# Patient Record
Sex: Female | Born: 1985
Health system: Southern US, Community
[De-identification: ages and names within clinical notes are randomized; demographics above are authoritative.]

## PROBLEM LIST (undated history)

## (undated) ENCOUNTER — Inpatient Hospital Stay (HOSPITAL_COMMUNITY): Payer: Self-pay

## (undated) DIAGNOSIS — A630 Anogenital (venereal) warts: Secondary | ICD-10-CM

## (undated) DIAGNOSIS — B999 Unspecified infectious disease: Secondary | ICD-10-CM

## (undated) DIAGNOSIS — E039 Hypothyroidism, unspecified: Secondary | ICD-10-CM

## (undated) DIAGNOSIS — J302 Other seasonal allergic rhinitis: Secondary | ICD-10-CM

## (undated) DIAGNOSIS — IMO0002 Reserved for concepts with insufficient information to code with codable children: Secondary | ICD-10-CM

## (undated) DIAGNOSIS — K219 Gastro-esophageal reflux disease without esophagitis: Secondary | ICD-10-CM

## (undated) DIAGNOSIS — O139 Gestational [pregnancy-induced] hypertension without significant proteinuria, unspecified trimester: Secondary | ICD-10-CM

## (undated) DIAGNOSIS — R87619 Unspecified abnormal cytological findings in specimens from cervix uteri: Secondary | ICD-10-CM

## (undated) DIAGNOSIS — R87629 Unspecified abnormal cytological findings in specimens from vagina: Secondary | ICD-10-CM

## (undated) DIAGNOSIS — D649 Anemia, unspecified: Secondary | ICD-10-CM

## (undated) DIAGNOSIS — O24419 Gestational diabetes mellitus in pregnancy, unspecified control: Secondary | ICD-10-CM

## (undated) HISTORY — PX: COLPOSCOPY: SHX161

## (undated) HISTORY — DX: Unspecified abnormal cytological findings in specimens from cervix uteri: R87.619

## (undated) HISTORY — DX: Gastro-esophageal reflux disease without esophagitis: K21.9

## (undated) HISTORY — DX: Gestational (pregnancy-induced) hypertension without significant proteinuria, unspecified trimester: O13.9

## (undated) HISTORY — PX: WISDOM TOOTH EXTRACTION: SHX21

## (undated) HISTORY — DX: Anogenital (venereal) warts: A63.0

## (undated) HISTORY — DX: Anemia, unspecified: D64.9

## (undated) HISTORY — DX: Other seasonal allergic rhinitis: J30.2

## (undated) HISTORY — PX: NO PAST SURGERIES: SHX2092

## (undated) HISTORY — DX: Reserved for concepts with insufficient information to code with codable children: IMO0002

---
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
## 2006-01-28 ENCOUNTER — Other Ambulatory Visit: Admission: RE | Admit: 2006-01-28 | Discharge: 2006-01-28 | Payer: Self-pay | Admitting: Family Medicine

## 2006-07-01 ENCOUNTER — Other Ambulatory Visit: Admission: RE | Admit: 2006-07-01 | Discharge: 2006-07-01 | Payer: Self-pay | Admitting: Obstetrics & Gynecology

## 2006-11-24 ENCOUNTER — Other Ambulatory Visit: Admission: RE | Admit: 2006-11-24 | Discharge: 2006-11-24 | Payer: Self-pay | Admitting: Obstetrics & Gynecology

## 2007-04-30 ENCOUNTER — Other Ambulatory Visit: Admission: RE | Admit: 2007-04-30 | Discharge: 2007-04-30 | Payer: Self-pay | Admitting: Obstetrics & Gynecology

## 2007-11-04 ENCOUNTER — Other Ambulatory Visit: Admission: RE | Admit: 2007-11-04 | Discharge: 2007-11-04 | Payer: Self-pay | Admitting: Obstetrics & Gynecology

## 2008-04-19 ENCOUNTER — Ambulatory Visit: Payer: Self-pay | Admitting: Internal Medicine

## 2008-04-19 DIAGNOSIS — J4540 Moderate persistent asthma, uncomplicated: Secondary | ICD-10-CM | POA: Insufficient documentation

## 2008-05-06 ENCOUNTER — Other Ambulatory Visit: Admission: RE | Admit: 2008-05-06 | Discharge: 2008-05-06 | Payer: Self-pay | Admitting: Obstetrics & Gynecology

## 2008-05-20 ENCOUNTER — Ambulatory Visit: Payer: Self-pay | Admitting: Internal Medicine

## 2008-11-11 ENCOUNTER — Other Ambulatory Visit: Admission: RE | Admit: 2008-11-11 | Discharge: 2008-11-11 | Payer: Self-pay | Admitting: Obstetrics & Gynecology

## 2009-01-09 ENCOUNTER — Telehealth (INDEPENDENT_AMBULATORY_CARE_PROVIDER_SITE_OTHER): Payer: Self-pay | Admitting: *Deleted

## 2009-03-29 ENCOUNTER — Telehealth: Payer: Self-pay | Admitting: Internal Medicine

## 2009-03-29 ENCOUNTER — Telehealth (INDEPENDENT_AMBULATORY_CARE_PROVIDER_SITE_OTHER): Payer: Self-pay | Admitting: *Deleted

## 2009-04-12 ENCOUNTER — Other Ambulatory Visit: Admission: RE | Admit: 2009-04-12 | Discharge: 2009-04-12 | Payer: Self-pay | Admitting: Obstetrics & Gynecology

## 2009-05-25 ENCOUNTER — Telehealth (INDEPENDENT_AMBULATORY_CARE_PROVIDER_SITE_OTHER): Payer: Self-pay | Admitting: *Deleted

## 2009-05-29 ENCOUNTER — Ambulatory Visit: Payer: Self-pay | Admitting: Internal Medicine

## 2009-08-16 ENCOUNTER — Telehealth (INDEPENDENT_AMBULATORY_CARE_PROVIDER_SITE_OTHER): Payer: Self-pay | Admitting: *Deleted

## 2010-02-21 ENCOUNTER — Telehealth: Payer: Self-pay | Admitting: Internal Medicine

## 2010-03-30 ENCOUNTER — Ambulatory Visit: Payer: Self-pay | Admitting: Internal Medicine

## 2010-03-30 DIAGNOSIS — R21 Rash and other nonspecific skin eruption: Secondary | ICD-10-CM | POA: Insufficient documentation

## 2010-03-30 DIAGNOSIS — J309 Allergic rhinitis, unspecified: Secondary | ICD-10-CM | POA: Insufficient documentation

## 2010-04-02 ENCOUNTER — Telehealth: Payer: Self-pay | Admitting: Internal Medicine

## 2011-01-23 NOTE — Progress Notes (Signed)
Summary: TALK TO DR [facility]  Phone Note Call from Patient   Caller: Patient Call For: [facility] Summary of Call: PT WOULD LIKE TO TALK TO DR Marchelle Gearing ABOUT APPT SHE HAD ON 03-30-2010 Initial call taken by: Rickard Patience,  April 02, 2010 10:05 AM  Follow-up for Phone Call        The patient is wanting to continue on Advair for now. She says she is following all other recs from last OV and she is taking her Advair twice daily. She would like to wait on starting the [facility] and discuss this further at her next OV in 6 weeks. Please advise.Michel Bickers [facility]  April 02, 2010 11:54 AM  Additional Follow-up for Phone Call Additional follow up Details #1::        that is fine. She just needs to be 100% compliant. Please confirm her advair dose and if she wants she can come in and take samples. Pls convey message. Does she still need to talk to me? Additional Follow-up by: Kalman Shan MD,  April 02, 2010 12:33 PM    Additional Follow-up for Phone Call Additional follow up Details #2::    pt advised of recs. she states nothing further needed. she also requests rx be sent to [facility] on [address] for advair. She also asks about coupons for advair, she request this be mailed to [address] [place] [address]. I have placed coupon in the mail, rx sent pt aware.Carron Curie CMA  April 02, 2010 12:45 PM   New/Updated Medications: ADVAIR DISKUS 250-50 MCG/DOSE AEPB (FLUTICASONE-SALMETEROL) one puff twice daily Prescriptions: ADVAIR DISKUS 250-50 MCG/DOSE AEPB (FLUTICASONE-SALMETEROL) one puff twice daily  #1 x 6   Entered by:   Carron Curie CMA   Authorized by:   Kalman Shan MD   Signed by:   Carron Curie CMA on 04/02/2010   Method used:   Electronically to        [facility] [address] (retail)       [phone]       [place], [place]  [address]       Ph: [phone]       Fax: [phone]   RxID:   [id number]

## 2011-01-23 NOTE — Progress Notes (Signed)
Summary: speak to doc  Phone Note Call from Patient Call back at Home Phone [phone]   Caller: Patient Call For: Obryan Radu Reason for Call: Talk to Doctor Summary of Call: pt wants to speak to MR re: what she is going thru - pt has an appt in April, thinks she may need some prednisone, asthma getting a little worse.  Wanted to touch base with him and see if he wants her to come in sooner. Initial call taken by: Eugene Gavia,  February 21, 2010 1:40 PM  Follow-up for Phone Call        spoke with pt and she states she is not compliant with her advair and she used to be able to go 3 days without noticing she missed using it, but now she states she is having to use it everyday, which is what she is supposed to do. She states she is having increased wheezing and chest tightness and thinks she may need prednisone. Pt stated she had wanted to speak to MR personally. I stated I could send him a note to ask him to call pt. Sh estated there was no need. Sh ehas made an appt for april, but states she will call back to move appt up sooner once she receives her current school schedule. Sh states we do not need to sen dmessage to MR about prednisone, she will wait to appt.  Carron Curie CMA  February 21, 2010 3:48 PM

## 2011-01-23 NOTE — Assessment & Plan Note (Signed)
Summary: rov/apc   Visit Type:  Follow-up Copy to:  Dr. Charna Elizabeth Primary Provider/Referring Provider:  Dr. Leanora Ivanoff [facility]   History of Present Illness: ASTHMA - Last visit 04/19/2008 and 04/2008 and 05/29/2009. On advair 250/50 a  OV 03/30/2010 Followup Asthma. She has not seen me in almost a year. Quit work Dec 2010. 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 Moved out of parents. Living with husband in independent house. Jan - March: asthma was bad, was wheezing all the time, using xoepenex a lot but was noncompliant with advair. Now feeling better afte starting advair 250/50 1 puff two times a day 2 weeks ago. Not wheezing. Not using xopenex. AGain social stressors but better than June 2010. New school. New house. Married life is better. Concerned about spring season and allergies. Is reporting eyes itching, sinus drainage and urtricarial rash occ.   CardioPerfect Spirometry  ID: [id number] Patient: KENNEDE, Cathy Daniels DOB: 1986-10-29 Age: 25 Years Old Sex: Female Race: White Height: 63 Weight: 194.38 Status: Unconfirmed Past Medical History:  GERD - on aciphex since march 2008. Under control. Stopped taking mid-march 2009 Spring time allergies Anemia Sulfur Allergy - rash/hive asthma - 2009 Recorded: 03/30/2010 09:33 AM  Parameter  Measured Predicted %Predicted FVC     2.79        3.44        81 FEV1     1.90        3.00        63.50 FEV1%   68.31        85.92        79.50 PEF    3.19        6.74        47.40   Interpretation: moderate obstructon, personally reviwed  Current Medications (verified): 1)  Xopenex Hfa 45 Mcg/act  Aero (Levalbuterol Tartrate) .... 2 Puff As Needed Ever 6 Hours 2)  Advair Diskus 250-50 Mcg/dose Aepb (Fluticasone-Salmeterol) .... One Puff Twice Daily 3)  Complete Allergy Medicine 25 Mg Tabs (Diphenhydramine Hcl) .... As Needed  Allergies (verified): 1)  ! 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 GERD - on aciphex since march 2008. Under control. Stopped taking mid-march 2009 Spring time allergies Anemia Sulfur Allergy - rash/hive asthma - 2009  Past Surgical History: Reviewed history from 04/19/2008 and no changes required. None  Family History: Reviewed history from 04/19/2008 and no changes required. 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  rash +  Vital Signs:  Patient profile:   25 year old female Height:      63 inches Weight:      194.38 pounds BMI:     34.56 O2 Sat:      98 % on Room air Temp:     98.0 degrees F oral Pulse rate:   95 / minute BP sitting:   112 / 62  (right arm) Cuff size:   regular  Vitals Entered By: Carron Curie CMA (March 30, 2010 9:19 AM)  O2 Flow:  Room air Comments Medications reviewed with patient Carron Curie CMA  March 30, 2010 9:20 AM Daytime phone number verified with patient.    Physical Exam  General:  well developed, well nourished, in no acute distress Head:  normocephalic and atraumatic Eyes:  PERRLA/EOM intact; conjunctiva and sclera clear Ears:  TMs intact and clear with normal canals Nose:  no deformity, discharge, inflammation, or lesions Mouth:  no deformity or lesions Neck:  no masses, thyromegaly, or abnormal cervical nodes Chest Wall:  no deformities noted Lungs:  clear bilaterally to auscultation and percussion Heart:  regular rate and rhythm, S1, S2 without murmurs, rubs, gallops, or clicks Abdomen:  bowel sounds positive; abdomen soft and non-tender without masses, or organomegaly Msk:  no deformity or scoliosis noted with normal posture Pulses:  pulses normal Extremities:  no clubbing, cyanosis, edema, or deformity noted Neurologic:  CN II-XII grossly intact with normal reflexes, coordination, muscle strength and tone Skin:  eczematous rash:.  rt neck and foream  Cervical Nodes:  no significant adenopathy Axillary Nodes:  no significant adenopathy Psych:  alert and cooperative; normal mood and affect; normal attention span and concentration   Pulmonary Function Test Date: 03/30/2010 09:33 AM Gender: Female  Pre-Spirometry FVC    Value: 2.79 L/min   % Pred: 81 % FEV1    Value: 1.90 L     Pred: 3.00 L     % Pred: 63.50 % FEV1/FVC  Value: 68.31 %     % Pred: 79.50 %  Comments: 600cc drop in fev1 since June 2010  Impression & Recommendations:  Problem # 1:  RASH AND OTHER NONSPECIFIC SKIN ERUPTION (ICD-782.1) Assessment New  will address at fu. Might need allergy referral. Need to establish inhaler compliance first  Orders: Est. Patient Level IV (40981)  Problem # 2:  ASTHMA, PERSISTENT, SEVERE (ICD-493.90) Assessment: Deteriorated Symptms are better but fev1 is worse compare to June 2010. She is poorly compliant  PLAN Counselled on  importance of compliance Swtich advair medium dose to high dose dulera rov 6 weeks with spirometry  Problem # 3:  ALLERGIC RHINITIS CAUSE UNSPECIFIED (ICD-477.9) Assessment: New  Has recurrence of her spring nasal allergies  plan nasal steroids Her updated medication list for this problem includes:    Complete Allergy Medicine 25 Mg Tabs (Diphenhydramine hcl) .[name]... As needed    Fluticasone Propionate 50 Mcg/act Susp (Fluticasone propionate) .[name]... 2 squirts each nostril daily  Orders: Est. Patient Level IV (19147)  Medications Added to Medication List This Visit: 1)  Advair Diskus 250-50 Mcg/dose Aepb (Fluticasone-salmeterol) .... One puff twice daily 2)  Dulera 200-5 Mcg/act Aero (Mometasone furo-formoterol fum) .... 2 puffs twice per day 3)  Complete Allergy Medicine 25 Mg Tabs (Diphenhydramine hcl) .... As needed 4)  Fluticasone Propionate 50 Mcg/act Susp (Fluticasone propionate) .... 2 squirts each nostril daily  Patient Instructions: 1)  stop advair 2)  take dulera high dose 2 puff  two times a day  3)   - take 2 samples with you 4)   - show technique to my nurse 5)  take some nasal steroid samples (2) 6)   - show technique to my nurse 7)    - take it 2 squirs each nostril daily 8)    - after samples over use prescription fluticasone 9)  use netti pot as directed in the manual at pharmacy 10)  return to see me 6 weeks  11)  spirometry at followup 12)  compliance important Prescriptions: FLUTICASONE PROPIONATE 50 MCG/ACT SUSP (FLUTICASONE PROPIONATE) 2 squirts each nostril daily  #1 x 6   Entered and Authorized by:   Kalman Shan MD   Signed by:   Kalman Shan MD on 03/30/2010   Method used:   Electronically to        [facility] [address] (retail)       [phone]       [place], [place]  [address]       Ph: [phone]       Fax: [phone]   RxID:   [id number] DULERA 200-5 MCG/ACT AERO (MOMETASONE FURO-FORMOTEROL  FUM) 2 puffs twice per day  #1 x 6   Entered and Authorized by:   Kalman Shan MD   Signed by:   Kalman Shan MD on 03/30/2010   Method used:   Electronically to        [facility] [address] (retail)       [phone]       [place], [place]  [address]       Ph: [phone]       Fax: [phone]   RxID:   [id number]    Immunization History:  Influenza Immunization History:    Influenza:  fluvax 3+ (11/22/2009)  Appended Document: rov/apc     Allergies: 1)  ! Sulfa   Other Orders: HFA Instruction [id number])

## 2011-05-10 ENCOUNTER — Telehealth: Payer: Self-pay | Admitting: Internal Medicine

## 2011-05-10 MED ORDER — FLUTICASONE-SALMETEROL 250-50 MCG/DOSE IN AEPB: 1.0000 | INHALATION_SPRAY | Freq: Two times a day (BID) | RESPIRATORY_TRACT | Status: DC

## 2011-05-10 NOTE — Telephone Encounter (Signed)
Called, spoke with pt.  States she was trying to get refills on advair 250/50 but was told the rx had expired but she still has refills left for advair 500.  Pt requesting rx for adviar 250 as this is what strength she is supposed to be on - I explained to her it has been over 1 yr since seen.  Will send in rx to last but OV will need to be kept.  Pt scheduled ov for July 24 with MR and verbalized understanding of instructions.

## 2011-06-10 ENCOUNTER — Ambulatory Visit: Payer: Self-pay | Admitting: Internal Medicine

## 2011-07-05 ENCOUNTER — Telehealth: Payer: Self-pay | Admitting: *Deleted

## 2011-07-05 NOTE — Telephone Encounter (Signed)
I need to r/s the pt appt. Pt can be placed on TP schedule. I lmtcbx1. Carron Curie, CMA

## 2011-07-08 NOTE — Telephone Encounter (Signed)
PATIENT RETURNED JENNIFER'S CALL.  SHE DOES NOT WANT TO SEE ANYONE OTHER THAN DR Marchelle Gearing

## 2011-07-08 NOTE — Telephone Encounter (Signed)
Pt appt changed to 07-26-11. Carron Curie, CMA

## 2011-07-16 ENCOUNTER — Ambulatory Visit: Payer: Self-pay | Admitting: Internal Medicine

## 2011-07-26 ENCOUNTER — Encounter: Payer: Self-pay | Admitting: Internal Medicine

## 2011-07-26 ENCOUNTER — Ambulatory Visit (INDEPENDENT_AMBULATORY_CARE_PROVIDER_SITE_OTHER): Payer: Managed Care, Other (non HMO) | Admitting: Internal Medicine

## 2011-07-26 VITALS — BP 112/82 | HR 78 | Temp 97.5°F | Ht 64.0 in | Wt 209.4 lb

## 2011-07-26 DIAGNOSIS — J45909 Unspecified asthma, uncomplicated: Secondary | ICD-10-CM

## 2011-07-26 NOTE — Patient Instructions (Signed)
Your asthma numbers show good control Continue current dose of advair without fail We will call you regarding AUSTRI study Take some nasal steroid and advair samples Have flu shot in fall  Return in 9 months or sooner if needed

## 2011-07-26 NOTE — Progress Notes (Signed)
  Subjective:    Patient ID: Cathy Daniels, female    DOB: 1986-09-04, 25 y.o.   MRN: [id number]  HPI  Followup for a) Moderate - severe persistent asthma. Prior visits April 2009, May 2009, June 2010 and April 2011. Maintained on advair; b) spring nasal allergies  OV 07/26/2011: Doing well from asthma standpoint. In interim since April 2011, reports improved symptoms and very good asthma control.  Denies current dyspnea. Had dyspnea in spring worsened by pollen and was associated with nasal allergies. She is compliant now with advair which is helping. Denies associcated wheeze, chest tightness, cough, nocturnal awakening or albuterol rescue use in several months, fever, chills, nausea, vomit. 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 Not smoking. Husband looking for job Family hx: no new dx of asthma   Review of Systems  Constitutional: Negative for fever and unexpected weight change.  HENT: Negative for ear pain, nosebleeds, congestion, sore throat, rhinorrhea, sneezing, trouble swallowing, dental problem, postnasal drip and sinus pressure.   Eyes: Negative for redness and itching.  Respiratory: Negative for cough, chest tightness, shortness of breath and wheezing.   Cardiovascular: Negative for palpitations and leg swelling.  Gastrointestinal: Negative for nausea and vomiting.  Genitourinary: Negative for dysuria.  Musculoskeletal: Negative for joint swelling.  Skin: Negative for rash.  Neurological: Negative for headaches.  Hematological: Does not bruise/bleed easily.  Psychiatric/Behavioral: Negative for dysphoric mood. The patient is not nervous/anxious.        Objective:   Physical Exam  Vitals reviewed. Constitutional: She is oriented to person, place, and time. She appears well-developed and well-nourished. No distress.     obese  HENT:  Head: Normocephalic and atraumatic.  Right Ear: External ear normal.  Left Ear: External ear normal.  Mouth/Throat: Oropharynx is clear and moist. No oropharyngeal exudate.  Eyes: Conjunctivae and EOM are normal. Pupils are equal, round, and reactive to light. Right eye exhibits no discharge. Left eye exhibits no discharge. No scleral icterus.  Neck: Normal range of motion. Neck supple. No JVD present. No tracheal deviation present. No thyromegaly present.  Cardiovascular: Normal rate, regular rhythm, normal heart sounds and intact distal pulses.  Exam reveals no gallop and no friction rub.   No murmur heard. Pulmonary/Chest: Effort normal and breath sounds normal. No respiratory distress. She has no wheezes. She has no rales. She exhibits no tenderness.  Abdominal: Soft. Bowel sounds are normal. She exhibits no distension and no mass. There is no tenderness. There is no rebound and no guarding.  Musculoskeletal: Normal range of motion. She exhibits no edema and no tenderness.  Lymphadenopathy:    She has no cervical adenopathy.  Neurological: She is alert and oriented to person, place, and time. She has normal reflexes. No cranial nerve deficit. She exhibits normal muscle tone. Coordination normal.  Skin: Skin is warm and dry. No rash noted. She is not diaphoretic. No erythema. No pallor.  Psychiatric: She has a normal mood and affect. Her behavior is normal. Judgment and thought content normal.          Assessment & Plan:

## 2011-07-26 NOTE — Assessment & Plan Note (Signed)
Cathy Daniels 05/29/2009 - fev1 3.5/80% Cathy Daniels 03/30/2010 - fev1 1.9/63%, ratio 68 Spiro 07/26/2011 - fev1 2.7L/81%, FVC 3L/795, ratio 88. Stable well controlled ashtma on moderate persistent regimen  PLAN Advised continued compliance with advair 250/50 Nasal allergy control with nasal steroids Discussed AUSTRI trial - she wil consider but school work could produce conflicts ROV 9 months

## 2012-04-20 ENCOUNTER — Other Ambulatory Visit: Payer: Self-pay | Admitting: Internal Medicine

## 2012-04-20 ENCOUNTER — Telehealth: Payer: Self-pay | Admitting: Internal Medicine

## 2012-04-20 MED ORDER — FLUTICASONE-SALMETEROL 250-50 MCG/DOSE IN AEPB: 1.0000 | INHALATION_SPRAY | Freq: Two times a day (BID) | RESPIRATORY_TRACT | Status: DC

## 2012-04-20 NOTE — Telephone Encounter (Signed)
I spoke with pt and she stated she needed a refill on her advair. Pt scheduled an apt to see MR on 05/11/12 at 4:15 and i advised her she will need to keep OV for further refills. She voiced her understanding and needed nothing further. rx has been sent

## 2012-05-11 ENCOUNTER — Encounter: Payer: Self-pay | Admitting: Internal Medicine

## 2012-05-11 ENCOUNTER — Ambulatory Visit (INDEPENDENT_AMBULATORY_CARE_PROVIDER_SITE_OTHER): Payer: Managed Care, Other (non HMO) | Admitting: Internal Medicine

## 2012-05-11 VITALS — BP 122/78 | HR 79 | Temp 98.2°F | Ht 63.5 in | Wt 191.6 lb

## 2012-05-11 DIAGNOSIS — J45909 Unspecified asthma, uncomplicated: Secondary | ICD-10-CM

## 2012-05-11 DIAGNOSIS — J309 Allergic rhinitis, unspecified: Secondary | ICD-10-CM

## 2012-05-11 DIAGNOSIS — J4540 Moderate persistent asthma, uncomplicated: Secondary | ICD-10-CM

## 2012-05-11 MED ORDER — FLUTICASONE-SALMETEROL 250-50 MCG/DOSE IN AEPB: 1.0000 | INHALATION_SPRAY | Freq: Two times a day (BID) | RESPIRATORY_TRACT | Status: DC

## 2012-05-11 NOTE — Progress Notes (Signed)
Subjective:    Patient ID: Cathy Daniels, female    DOB: July 04, 1986, 26 y.o.   MRN: [id number]  HPI Followup for a) Moderate - severe persistent asthma. Prior visits April 2009, May 2009, June 2010 and April 2011. Maintained on advair; b) spring nasal allergies  OV 07/26/2011: Doing well from asthma standpoint. In interim since April 2011, reports improved symptoms and very good asthma control.  Denies current dyspnea. Had dyspnea in spring worsened by pollen and was associated with nasal allergies. She is compliant now with advair which is helping. Denies associcated wheeze, chest tightness, cough, nocturnal awakening or albuterol rescue use in several months, fever, chills, nausea, vomit. 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 Husband looking for job Family hx: no new dx of asthma  REC Your asthma numbers show good control  Continue current dose of advair without fail  We will call you regarding AUSTRI study  Take some nasal steroid and advair samples  Have flu shot in fall  Return in 9 months or sooner if needed  OV 05/11/2012  9 month followup for moderate persistent asthma. Presents with husband. OVerall well. Using advair only once a day and sometimes forgets but states albuterol use is very rare and rates asthma as very well controlled. Denies nocturnal awakenings, dyspnea, chst tightness, wheeze, cough. Spirometry - fev1  2.48L/75%, FVC 3.13L/82%,Ratio 79  (caucasian std used) and is a 220 cc worse than agusut 2012. Only issue is some increase in nasal allergies consistent with spring season  Past, Family, Social reviewed: diagnosed with diabeets and is now on diet and exercise program and has lost weight. Finishing nursing school   Review of Systems  Constitutional: Negative for fever and  unexpected weight change.  HENT: Negative for ear pain, nosebleeds, congestion, sore throat, rhinorrhea, sneezing, trouble swallowing, dental problem, postnasal drip and sinus pressure.   Eyes: Negative for redness and itching.  Respiratory: Negative for cough, chest tightness, shortness of breath and wheezing.   Cardiovascular: Negative for palpitations and leg swelling.  Gastrointestinal: Negative for nausea and vomiting.  Genitourinary: Negative for dysuria.  Musculoskeletal: Negative for joint swelling.  Skin: Negative for rash.  Neurological: Negative for headaches.  Hematological: Does not bruise/bleed easily.  Psychiatric/Behavioral: Negative for dysphoric mood. The patient is not nervous/anxious.        Objective:   Physical Exam Vitals reviewed. Constitutional: She is oriented to person, place, and time. She appears well-developed and well-nourished. No distress.       obese  HENT:  Head: Normocephalic and atraumatic.  Right Ear: External ear normal.  Left Ear: External ear normal.  Mouth/Throat: Oropharynx is clear and moist. No oropharyngeal exudate.  Eyes: Conjunctivae and EOM are normal. Pupils are equal, round, and reactive to light. Right eye exhibits no discharge. Left eye exhibits no discharge. No scleral icterus.  Neck: Normal range of motion. Neck supple. No JVD present. No tracheal deviation present. No thyromegaly present.  Cardiovascular: Normal rate, regular rhythm, normal heart sounds and intact distal pulses.  Exam reveals no gallop and no friction rub.   No murmur heard. Pulmonary/Chest: Effort normal and breath sounds normal. No respiratory distress. She has no wheezes. She has no rales. She exhibits no tenderness.  Abdominal: Soft. Bowel sounds are normal. She exhibits no distension and no mass. There is no tenderness. There  is no rebound and no guarding.  Musculoskeletal: Normal range of motion. She exhibits no edema and no tenderness.  Lymphadenopathy:     She has no cervical adenopathy.  Neurological: She is alert and oriented to person, place, and time. She has normal reflexes. No cranial nerve deficit. She exhibits normal muscle tone. Coordination normal.  Skin: Skin is warm and dry. No rash noted. She is not diaphoretic. No erythema. No pallor.  Psychiatric: She has a normal mood and affect. Her behavior is normal. Judgment and thought content normal.         Assessment & Plan:

## 2012-05-11 NOTE — Patient Instructions (Signed)
Glad you are doing well Lung numbers are a bit down but still reasonable Continue advair 250/50 atleast 1 time daily  Take nasonex for spring allergies Return in 12 months or sooner if needed Spirometry at return

## 2012-05-18 NOTE — Assessment & Plan Note (Signed)
  Cathy Daniels 05/29/2009 - fev1 3.5/80% Cathy Daniels 03/30/2010 - fev1 1.9/63%, ratio 68 Spiro 07/26/2011 - fev1 2.7L/81%, FVC 3L/795, ratio 88 Spiro May 2013 - fev1 2.48L   PLAN  Cathy Daniels 05/29/2009 - fev1 3.5/80% Cathy Daniels 03/30/2010 - fev1 1.9/63%, ratio 68 Spiro 07/26/2011 - fev1 2.7L/81%, FVC 3L/795, ratio 88 Spiro May 2013 - fev1 2.48L

## 2012-05-18 NOTE — Assessment & Plan Note (Signed)
Nasal steroids

## 2013-02-02 ENCOUNTER — Telehealth: Payer: Self-pay | Admitting: Internal Medicine

## 2013-02-02 MED ORDER — FLUTICASONE-SALMETEROL 250-50 MCG/DOSE IN AEPB: 1.0000 | INHALATION_SPRAY | Freq: Two times a day (BID) | RESPIRATORY_TRACT | Status: DC

## 2013-02-02 NOTE — Telephone Encounter (Signed)
lmomtcb x1 for pt--sample left upfront for p/u

## 2013-02-02 NOTE — Telephone Encounter (Signed)
Pt aware sample is up front.Cathy Daniels ° °

## 2013-04-14 ENCOUNTER — Telehealth: Payer: Self-pay | Admitting: Internal Medicine

## 2013-04-14 NOTE — Telephone Encounter (Signed)
Called pt to schd follow up apt.  pt will call back when she is ready to see him, does not want to see him at this time

## 2013-04-26 ENCOUNTER — Telehealth: Payer: Self-pay | Admitting: Internal Medicine

## 2013-04-26 NOTE — Telephone Encounter (Signed)
Will forward to MR then.

## 2013-04-27 MED ORDER — BUDESONIDE 180 MCG/ACT IN AEPB: 1.0000 | INHALATION_SPRAY | Freq: Two times a day (BID) | RESPIRATORY_TRACT | Status: DC

## 2013-04-27 NOTE — Telephone Encounter (Signed)
She is pregnant so came off advair last week. She was having increased symptoms even while on advair; attrubited to new apt where she thinks prior tenant smoked   rec 1. Work with Tree surgeon to get it cleaned 2. Come to office and pick up pulmicort samples worth 4 weeks - give 1 puff bid 3. Give ROV 3-4 weeks with spiromety at fu   Dr. Kalman Shan, M.D., [facility].C.P Pulmonary and Critical Care Medicine Staff Physician [facility] [facility] Pager: [phone], If no answer or between  15:00h - 7:00h: call [phone]  04/27/2013 8:53 AM

## 2013-04-27 NOTE — Telephone Encounter (Signed)
Patient has received samples and I have instructed her on how to use. Verbalized understanding and nothing further needed at this time. Patient is switching insurances at this time and will call back to schedule appt. Nothing further at this time

## 2013-04-28 ENCOUNTER — Telehealth: Payer: Self-pay | Admitting: Internal Medicine

## 2013-04-28 NOTE — Telephone Encounter (Signed)
I spoke with pt and she was calling to make sure it was 1 puff BID and wash her mouth out after each use. I advised her this was correct. Nothing further was needed

## 2013-05-04 ENCOUNTER — Telehealth: Payer: Self-pay | Admitting: Obstetrics & Gynecology

## 2013-05-04 DIAGNOSIS — Z3201 Encounter for pregnancy test, result positive: Secondary | ICD-10-CM

## 2013-05-04 NOTE — Telephone Encounter (Signed)
Wants to discuss several options with either Tresa Endo or Dr.Miller for a specific reputable provider-- since discovering pregnancy

## 2013-05-04 NOTE — Telephone Encounter (Signed)
Needs viability scan.  We can talk then.  :)

## 2013-05-04 NOTE — Telephone Encounter (Signed)
Patient is about 8 weeks by LMP.  Needs viability scan next week if possible.  Please schedule.  She also needs to be on PNV, if possible.

## 2013-05-04 NOTE — Telephone Encounter (Signed)
Patient wants to discuss reputable providers with Cathy Daniels in regards to the discovery of her pregnancy

## 2013-05-04 NOTE — Telephone Encounter (Signed)
Cathy Daniels has patient coming in for confirmation of pregnancy test.  Then u/s can be scheduled.  Should be taken care of.

## 2013-05-04 NOTE — Telephone Encounter (Signed)
Spoke with patient states LMP 03/11/13, took at home UPT last Monday +-positive. No spotting, but some cramping and nausea. Does have [employer]. Would like to set up [place]. Aware will precert first and then call to set up the appointment.

## 2013-05-05 NOTE — Telephone Encounter (Signed)
Call to patient, reviewed insurance benefits and given option to come in for confirmation consult and then ultrasound next week versus cost effectiveness (for patient benefit, has not met deductible) of going straight to OB care.    Patient would prefer to confirm viability here is we can arrange payment plan.  Patient has seen Dr Talmage Nap and has thyroid labs retested and Synthroid adjusted.  Has been under increased stress and didn't realize she was pregnant.  Consult appt tomm 05-06-13 with Dr Hyacinth Meeker and plan ultraound for 05-10-13 pending results of tomm appointment.

## 2013-05-06 ENCOUNTER — Encounter: Payer: Self-pay | Admitting: Obstetrics & Gynecology

## 2013-05-06 ENCOUNTER — Ambulatory Visit (INDEPENDENT_AMBULATORY_CARE_PROVIDER_SITE_OTHER): Payer: No Typology Code available for payment source | Admitting: Obstetrics & Gynecology

## 2013-05-06 VITALS — BP 122/80 | HR 72 | Resp 16

## 2013-05-06 DIAGNOSIS — N912 Amenorrhea, unspecified: Secondary | ICD-10-CM

## 2013-05-06 NOTE — Progress Notes (Signed)
27 y.o. Married [place] female G0P0 here for discussion of home UPT pregnancy test.  Stopped pills in December.  January, February, March cycles were all normal.  Missed April cycle.  Feeling more emotional.  Having lots of nausea.  No signficant breast tenderness.  Also very fatigued.  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  Can retest in 4 days--early June.  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  Just moved to a new apartment.  Thinks a smoker lived their prior.  Feeling very sensitive to this.  Did see Dr. Talmage Nap last week.  Had TSH (question this because she is pregnant) drawn.  Synthroid was increased.  This was done 04/28/13.  LMP 03/11/13.   [facility] 12/17/13.  No VB.  Occ cramping.    Not on PNV.  Exercise and sexual activity discussed.  Dietary restrictions discussed.  No alcohol or smoking discussed.  They have no pets.  She was vaccinated for varicella.  Pretty sure she had rubella tested for application to nursing school.  Feels it was normal.  Results at [facility].  Questions if she needs Pap.  Advised of new ob visit, labs, and pap usually done at this visit.  All questions answered.  Labs today:  UPT + today  A:  Amenorrhea  P: Planning viability ultrasound on Monday.  Start PNV.  ~15 minutes spent with patient >50% of time was in face to face discussion of above.

## 2013-05-06 NOTE — Patient Instructions (Addendum)
Start PNV ASAP Ultrasound on Monday to assess for viabilit.

## 2013-05-07 ENCOUNTER — Encounter: Payer: Self-pay | Admitting: Obstetrics & Gynecology

## 2013-05-10 ENCOUNTER — Ambulatory Visit (INDEPENDENT_AMBULATORY_CARE_PROVIDER_SITE_OTHER): Payer: No Typology Code available for payment source

## 2013-05-10 ENCOUNTER — Encounter: Payer: Self-pay | Admitting: Obstetrics & Gynecology

## 2013-05-10 ENCOUNTER — Ambulatory Visit (INDEPENDENT_AMBULATORY_CARE_PROVIDER_SITE_OTHER): Payer: No Typology Code available for payment source | Admitting: Obstetrics & Gynecology

## 2013-05-10 DIAGNOSIS — N912 Amenorrhea, unspecified: Secondary | ICD-10-CM

## 2013-05-10 DIAGNOSIS — N831 Corpus luteum cyst of ovary, unspecified side: Secondary | ICD-10-CM

## 2013-05-10 DIAGNOSIS — Z3201 Encounter for pregnancy test, result positive: Secondary | ICD-10-CM

## 2013-05-10 LAB — US OB TRANSVAGINAL

## 2013-05-10 NOTE — Patient Instructions (Signed)
Please let us know when your new ob appointment is so we can fax records

## 2013-05-10 NOTE — Progress Notes (Signed)
27 y.o. Married [place] female here for a pelvic ultrasound for viability.  LMP 03/11/13 with [facility] 12/17/13.  EGA today 8 4/7 weeks.  No vaginal bleeding.  No pelvic pain.  Mild nausea.  Fatigue present.  Overall, though, feeling okay.  No emesis.    Patient's last menstrual period was 03/11/2013.  FINDINGS: UTERUS: normal gestation sac, yolk sac and fetal pole noted.  CRL 1.31mm with EGA of 7 5/7 wks.  FCA present.  HR 159 BPM. ADNEXA:   Left ovary 3.1 x 2.1 x 1.7cm   Right ovary 3.4 x 2.5 x 2.9cm with corpus luteal cyst noted CUL DE SAC:  No free fluid noted Results below.  Pictures shown to patient.  Discussed size<Dates finding.  Based on ultrasound, EDC should be 12/22/13.  Questions answered.  Patient and spouse discussed possible OB options.  Assessment:  Viable singleton pregnancy at 7 5/7 wks, S<D today with new EDC given. Hypothyroidism  Plan: Follow-up with OB practice for new ob appointment  ~15 minutes spent with patient >50% of time was in face to face discussion of above.

## 2013-05-18 ENCOUNTER — Telehealth: Payer: Self-pay | Admitting: Obstetrics & Gynecology

## 2013-05-18 ENCOUNTER — Encounter: Payer: Self-pay | Admitting: *Deleted

## 2013-05-18 NOTE — Telephone Encounter (Signed)
Pt says she need a letter for insurance purposes stating verification of her pregnancy and the due date.

## 2013-05-18 NOTE — Telephone Encounter (Signed)
See chart in your office. See note below.

## 2013-05-19 NOTE — Telephone Encounter (Signed)
Left message on CB# of letter for insurance is ready for pick up at the front desk of our office.

## 2013-05-19 NOTE — Telephone Encounter (Signed)
Signed note.  Is on desk area outside my office.

## 2013-05-26 MED ORDER — PROMETHAZINE HCL 12.5 MG PO TABS: 12.5000 mg | ORAL_TABLET | Freq: Four times a day (QID) | ORAL | Status: DC | PRN

## 2013-05-26 MED ORDER — PROMETHAZINE HCL 25 MG RE SUPP: 25.0000 mg | Freq: Four times a day (QID) | RECTAL | Status: DC | PRN

## 2013-05-26 NOTE — Telephone Encounter (Signed)
Le the know phenergan is called in and will see if we can get her in sooner.

## 2013-05-26 NOTE — Telephone Encounter (Signed)
Dr. Huel Cote, [facility] [phone]. Patient complains of severe nausea and vomitting with pregnancy. Requests we contact OB and see if they can get her in as soon as possible. They have her information but have not contacted her. Please advise.

## 2013-05-27 NOTE — Telephone Encounter (Signed)
Called Dr. Huel Cote office , spoke with receptionist of need to get patient seen ASAP due to severe nausea and vomiting with pregnancy. Demographic information given to scheduler and states will call patient to set up appt. EPIC demographic, office notes faxed to Dr. Nino Parsley. sue

## 2013-05-27 NOTE — Telephone Encounter (Signed)
Left message on call back # that Rx was sent to her [facility] for pherghan. Patient to call back to let us know how she is feeling. Cathy Daniels

## 2013-05-28 LAB — OB RESULTS CONSOLE GC/CHLAMYDIA
Chlamydia: NEGATIVE
Gonorrhea: NEGATIVE

## 2013-05-28 LAB — OB RESULTS CONSOLE ABO/RH: RH Type: POSITIVE

## 2013-05-28 LAB — OB RESULTS CONSOLE HIV ANTIBODY (ROUTINE TESTING): HIV: NONREACTIVE

## 2013-05-28 LAB — OB RESULTS CONSOLE ANTIBODY SCREEN: Antibody Screen: NEGATIVE

## 2013-05-28 LAB — OB RESULTS CONSOLE HEPATITIS B SURFACE ANTIGEN: Hepatitis B Surface Ag: NEGATIVE

## 2013-06-07 ENCOUNTER — Ambulatory Visit: Payer: Managed Care, Other (non HMO) | Admitting: Internal Medicine

## 2013-06-15 ENCOUNTER — Other Ambulatory Visit (HOSPITAL_COMMUNITY): Payer: Self-pay | Admitting: Obstetrics and Gynecology

## 2013-06-15 DIAGNOSIS — O3680X0 Pregnancy with inconclusive fetal viability, not applicable or unspecified: Secondary | ICD-10-CM

## 2013-06-18 ENCOUNTER — Ambulatory Visit (HOSPITAL_COMMUNITY)
Admission: RE | Admit: 2013-06-18 | Discharge: 2013-06-18 | Disposition: A | Discharge status: Home / Self Care | Payer: Medicaid Other | Source: Ambulatory Visit | Attending: Obstetrics and Gynecology | Admitting: Obstetrics and Gynecology

## 2013-06-18 ENCOUNTER — Encounter (HOSPITAL_COMMUNITY): Payer: Self-pay

## 2013-06-18 ENCOUNTER — Other Ambulatory Visit (HOSPITAL_COMMUNITY): Payer: Self-pay | Admitting: Obstetrics and Gynecology

## 2013-06-18 ENCOUNTER — Other Ambulatory Visit: Payer: Self-pay

## 2013-06-18 VITALS — BP 126/88 | HR 93 | Wt 203.0 lb

## 2013-06-18 DIAGNOSIS — Z3689 Encounter for other specified antenatal screening: Secondary | ICD-10-CM | POA: Insufficient documentation

## 2013-06-18 DIAGNOSIS — O351XX0 Maternal care for (suspected) chromosomal abnormality in fetus, not applicable or unspecified: Secondary | ICD-10-CM | POA: Insufficient documentation

## 2013-06-18 DIAGNOSIS — Z369 Encounter for antenatal screening, unspecified: Secondary | ICD-10-CM

## 2013-06-18 DIAGNOSIS — O3510X0 Maternal care for (suspected) chromosomal abnormality in fetus, unspecified, not applicable or unspecified: Secondary | ICD-10-CM | POA: Insufficient documentation

## 2013-06-18 DIAGNOSIS — O3680X0 Pregnancy with inconclusive fetal viability, not applicable or unspecified: Secondary | ICD-10-CM

## 2013-06-18 NOTE — Progress Notes (Signed)
Cathy Daniels  was seen today for an ultrasound appointment.  See full report in [facility].  Impression: Single IUP at 13 2/7 weeks Normal NT (2.0 mm)  Nasal bone visualized First trimester aneuploidy screen performed as noted above.   Recommendations: Please do not draw triple/quad screen, though patient should be offered MSAFP for neural tube defect screening. Recommend ultrasound for fetal anatomy at 18 weeks.  Alpha Gula, MD

## 2013-06-23 ENCOUNTER — Ambulatory Visit (HOSPITAL_COMMUNITY): Payer: No Typology Code available for payment source

## 2013-09-17 ENCOUNTER — Telehealth: Payer: Self-pay | Admitting: Obstetrics & Gynecology

## 2013-09-17 NOTE — Telephone Encounter (Signed)
Patient was calling [place]. Just wanted to give her an update on something. She said if it is after 5 to call her on her cell instead of work.

## 2013-09-20 NOTE — Telephone Encounter (Signed)
Spoke with patient and her nausea is much better. She is seeing Dr Huel Cote and wanted to let us know she is having a girl.

## 2013-09-23 LAB — OB RESULTS CONSOLE RPR: RPR: NONREACTIVE

## 2013-10-22 ENCOUNTER — Telehealth: Payer: Self-pay | Admitting: Obstetrics & Gynecology

## 2013-10-22 NOTE — Telephone Encounter (Signed)
Called pt.  This was a personal call.

## 2013-10-22 NOTE — Telephone Encounter (Signed)
Cathy Daniels states she is returning Dr. Rondel Baton call from the other day but is not sure what it was about. No open phone note so routing call to triage.

## 2013-10-22 NOTE — Telephone Encounter (Signed)
Returning call. I did not call patient since I thought this was a personal call.

## 2013-10-29 LAB — HEMOGLOBIN A1C: Hemoglobin A1C: 6.5

## 2013-11-18 ENCOUNTER — Inpatient Hospital Stay (HOSPITAL_COMMUNITY)
Admission: AD | Admit: 2013-11-18 | Discharge: 2013-11-18 | Disposition: A | Discharge status: Home / Self Care | Payer: No Typology Code available for payment source | Source: Ambulatory Visit | Attending: Obstetrics and Gynecology | Admitting: Obstetrics and Gynecology

## 2013-11-18 DIAGNOSIS — O368390 Maternal care for abnormalities of the fetal heart rate or rhythm, unspecified trimester, not applicable or unspecified: Secondary | ICD-10-CM | POA: Insufficient documentation

## 2013-11-18 NOTE — MAU Note (Signed)
Pt here for scheduled NST.

## 2013-11-26 LAB — OB RESULTS CONSOLE GBS: GBS: NEGATIVE

## 2013-12-13 ENCOUNTER — Other Ambulatory Visit: Payer: Self-pay | Admitting: Obstetrics and Gynecology

## 2013-12-16 ENCOUNTER — Inpatient Hospital Stay (HOSPITAL_COMMUNITY)
Admission: AD | Admit: 2013-12-16 | Discharge: 2013-12-16 | Disposition: A | Discharge status: Home / Self Care | Payer: No Typology Code available for payment source | Source: Ambulatory Visit | Attending: Obstetrics and Gynecology | Admitting: Obstetrics and Gynecology

## 2013-12-16 ENCOUNTER — Encounter (HOSPITAL_COMMUNITY): Payer: Self-pay | Admitting: *Deleted

## 2013-12-16 DIAGNOSIS — O479 False labor, unspecified: Secondary | ICD-10-CM | POA: Insufficient documentation

## 2013-12-16 DIAGNOSIS — O99810 Abnormal glucose complicating pregnancy: Secondary | ICD-10-CM | POA: Insufficient documentation

## 2013-12-16 HISTORY — DX: Reserved for concepts with insufficient information to code with codable children: IMO0002

## 2013-12-16 HISTORY — DX: Gestational diabetes mellitus in pregnancy, unspecified control: O24.419

## 2013-12-16 NOTE — MAU Note (Signed)
Here for twice weekly NST, pt has gestational diab.  Reports having some contractions this morning, had been every 5 min, now spaced out.  Was 1/50 on Monday.  Had some spotting when went to the bathroom, first time every

## 2013-12-16 NOTE — MAU Note (Signed)
Increase in frequency and strength of ctx's

## 2013-12-17 ENCOUNTER — Inpatient Hospital Stay (HOSPITAL_COMMUNITY): Payer: No Typology Code available for payment source | Admitting: Anesthesiology

## 2013-12-17 ENCOUNTER — Inpatient Hospital Stay (HOSPITAL_COMMUNITY)
Admission: AD | Admit: 2013-12-17 | Discharge: 2013-12-19 | DRG: 774 | Disposition: A | Discharge status: Home / Self Care | Payer: No Typology Code available for payment source | Source: Ambulatory Visit | Attending: Obstetrics and Gynecology | Admitting: Obstetrics and Gynecology

## 2013-12-17 ENCOUNTER — Encounter (HOSPITAL_COMMUNITY): Payer: Self-pay | Admitting: *Deleted

## 2013-12-17 ENCOUNTER — Encounter (HOSPITAL_COMMUNITY): Payer: No Typology Code available for payment source | Admitting: Anesthesiology

## 2013-12-17 DIAGNOSIS — E119 Type 2 diabetes mellitus without complications: Secondary | ICD-10-CM | POA: Diagnosis present

## 2013-12-17 DIAGNOSIS — Z794 Long term (current) use of insulin: Secondary | ICD-10-CM

## 2013-12-17 DIAGNOSIS — O2432 Unspecified pre-existing diabetes mellitus in childbirth: Secondary | ICD-10-CM | POA: Diagnosis present

## 2013-12-17 DIAGNOSIS — Z3490 Encounter for supervision of normal pregnancy, unspecified, unspecified trimester: Secondary | ICD-10-CM

## 2013-12-17 LAB — GLUCOSE, CAPILLARY
Glucose-Capillary: 132 mg/dL — ABNORMAL HIGH (ref 70–99)
Glucose-Capillary: 101 mg/dL — ABNORMAL HIGH (ref 70–99)
Glucose-Capillary: 90 mg/dL (ref 70–99)
Glucose-Capillary: 134 mg/dL — ABNORMAL HIGH (ref 70–99)
Glucose-Capillary: 171 mg/dL — ABNORMAL HIGH (ref 70–99)

## 2013-12-17 LAB — CBC
HCT: 31.7 % — ABNORMAL LOW (ref 36.0–46.0)
Hemoglobin: 10.1 g/dL — ABNORMAL LOW (ref 12.0–15.0)
MCH: 23.1 pg — ABNORMAL LOW (ref 26.0–34.0)
MCHC: 31.9 g/dL (ref 30.0–36.0)
MCV: 72.5 fL — ABNORMAL LOW (ref 78.0–100.0)
Platelets: 367 10*3/uL (ref 150–400)
RBC: 4.37 MIL/uL (ref 3.87–5.11)
RDW: 16 % — ABNORMAL HIGH (ref 11.5–15.5)

## 2013-12-17 LAB — TYPE AND SCREEN: Antibody Screen: NEGATIVE

## 2013-12-17 LAB — ABO/RH: ABO/RH(D): B POS

## 2013-12-17 MED ORDER — SENNOSIDES-DOCUSATE SODIUM 8.6-50 MG PO TABS: 2.0000 | ORAL_TABLET | ORAL | Status: DC

## 2013-12-17 MED ORDER — BENZOCAINE-MENTHOL 20-0.5 % EX AERO: 1.0000 "application " | INHALATION_SPRAY | CUTANEOUS | Status: DC | PRN

## 2013-12-17 MED ORDER — DIPHENHYDRAMINE HCL 25 MG PO CAPS: 25.0000 mg | ORAL_CAPSULE | Freq: Four times a day (QID) | ORAL | Status: DC | PRN

## 2013-12-17 MED ORDER — IBUPROFEN 600 MG PO TABS
600.0000 mg | ORAL_TABLET | Freq: Four times a day (QID) | ORAL | Status: DC
  Administered 2013-12-17 – 2013-12-19 (×6): 600 mg via ORAL
  Filled 2013-12-17 (×6): qty 1

## 2013-12-17 MED ORDER — SIMETHICONE 80 MG PO CHEW: 80.0000 mg | CHEWABLE_TABLET | ORAL | Status: DC | PRN

## 2013-12-17 MED ORDER — ONDANSETRON HCL 4 MG/2ML IJ SOLN
4.0000 mg | INTRAMUSCULAR | Status: DC | PRN
Start: 2013-12-17 — End: 2013-12-19

## 2013-12-17 MED ORDER — LANOLIN HYDROUS EX OINT: TOPICAL_OINTMENT | CUTANEOUS | Status: DC | PRN

## 2013-12-17 MED ORDER — LACTATED RINGERS IV SOLN
500.0000 mL | Freq: Once | INTRAVENOUS | Status: AC
  Administered 2013-12-17: 500 mL via INTRAVENOUS

## 2013-12-17 MED ORDER — EPHEDRINE 5 MG/ML INJ: 10.0000 mg | INTRAVENOUS | Status: DC | PRN

## 2013-12-17 MED ORDER — OXYTOCIN 40 UNITS IN LACTATED RINGERS INFUSION - SIMPLE MED
1.0000 m[IU]/min | INTRAVENOUS | Status: DC
  Administered 2013-12-17: 1 m[IU]/min via INTRAVENOUS
  Filled 2013-12-17: qty 1000

## 2013-12-17 MED ORDER — OXYTOCIN 40 UNITS IN LACTATED RINGERS INFUSION - SIMPLE MED
62.5000 mL/h | INTRAVENOUS | Status: DC
  Administered 2013-12-17: 62.5 mL/h via INTRAVENOUS

## 2013-12-17 MED ORDER — ONDANSETRON HCL 4 MG/2ML IJ SOLN: 4.0000 mg | Freq: Four times a day (QID) | INTRAMUSCULAR | Status: DC | PRN

## 2013-12-17 MED ORDER — OXYCODONE-ACETAMINOPHEN 5-325 MG PO TABS
1.0000 | ORAL_TABLET | ORAL | Status: DC | PRN
  Administered 2013-12-17: 1 via ORAL
  Filled 2013-12-17: qty 1

## 2013-12-17 MED ORDER — FENTANYL 2.5 MCG/ML BUPIVACAINE 1/10 % EPIDURAL INFUSION (WH - ANES)
14.0000 mL/h | INTRAMUSCULAR | Status: DC | PRN
  Administered 2013-12-17: 14 mL/h via EPIDURAL
  Filled 2013-12-17 (×2): qty 125

## 2013-12-17 MED ORDER — PHENYLEPHRINE 40 MCG/ML (10ML) SYRINGE FOR IV PUSH (FOR BLOOD PRESSURE SUPPORT)
80.0000 ug | PREFILLED_SYRINGE | INTRAVENOUS | Status: DC | PRN
  Filled 2013-12-17: qty 10

## 2013-12-17 MED ORDER — IBUPROFEN 600 MG PO TABS: 600.0000 mg | ORAL_TABLET | Freq: Four times a day (QID) | ORAL | Status: DC

## 2013-12-17 MED ORDER — OXYCODONE-ACETAMINOPHEN 5-325 MG PO TABS: 1.0000 | ORAL_TABLET | ORAL | Status: DC | PRN

## 2013-12-17 MED ORDER — TERBUTALINE SULFATE 1 MG/ML IJ SOLN: 0.2500 mg | Freq: Once | INTRAMUSCULAR | Status: DC | PRN

## 2013-12-17 MED ORDER — WITCH HAZEL-GLYCERIN EX PADS: 1.0000 "application " | MEDICATED_PAD | CUTANEOUS | Status: DC | PRN

## 2013-12-17 MED ORDER — LACTATED RINGERS IV SOLN
500.0000 mL | INTRAVENOUS | Status: DC | PRN
  Administered 2013-12-17: 500 mL via INTRAVENOUS
  Administered 2013-12-17: 1000 mL via INTRAVENOUS

## 2013-12-17 MED ORDER — FLUTICASONE PROPIONATE HFA 44 MCG/ACT IN AERO
1.0000 | INHALATION_SPRAY | Freq: Two times a day (BID) | RESPIRATORY_TRACT | Status: DC
  Filled 2013-12-17: qty 10.6

## 2013-12-17 MED ORDER — MEASLES, MUMPS & RUBELLA VAC ~~LOC~~ INJ: 0.5000 mL | INJECTION | Freq: Once | SUBCUTANEOUS | Status: DC

## 2013-12-17 MED ORDER — OXYTOCIN 40 UNITS IN LACTATED RINGERS INFUSION - SIMPLE MED: 62.5000 mL/h | INTRAVENOUS | Status: AC | PRN

## 2013-12-17 MED ORDER — PHENYLEPHRINE 40 MCG/ML (10ML) SYRINGE FOR IV PUSH (FOR BLOOD PRESSURE SUPPORT)
80.0000 ug | PREFILLED_SYRINGE | INTRAVENOUS | Status: DC | PRN
  Administered 2013-12-17 (×2): 40 ug via INTRAVENOUS
  Filled 2013-12-17: qty 10

## 2013-12-17 MED ORDER — DIBUCAINE 1 % RE OINT
1.0000 "application " | TOPICAL_OINTMENT | RECTAL | Status: DC | PRN
  Filled 2013-12-17: qty 28

## 2013-12-17 MED ORDER — OXYTOCIN BOLUS FROM INFUSION
500.0000 mL | INTRAVENOUS | Status: DC
  Administered 2013-12-17: 500 mL via INTRAVENOUS

## 2013-12-17 MED ORDER — LACTATED RINGERS IV SOLN: INTRAVENOUS | Status: AC

## 2013-12-17 MED ORDER — CITRIC ACID-SODIUM CITRATE 334-500 MG/5ML PO SOLN: 30.0000 mL | ORAL | Status: DC | PRN

## 2013-12-17 MED ORDER — SENNOSIDES-DOCUSATE SODIUM 8.6-50 MG PO TABS
2.0000 | ORAL_TABLET | ORAL | Status: DC
  Administered 2013-12-17 – 2013-12-18 (×2): 2 via ORAL
  Filled 2013-12-17 (×2): qty 2

## 2013-12-17 MED ORDER — ZOLPIDEM TARTRATE 5 MG PO TABS: 5.0000 mg | ORAL_TABLET | Freq: Every evening | ORAL | Status: DC | PRN

## 2013-12-17 MED ORDER — DIPHENHYDRAMINE HCL 50 MG/ML IJ SOLN: 12.5000 mg | INTRAMUSCULAR | Status: DC | PRN

## 2013-12-17 MED ORDER — OXYCODONE-ACETAMINOPHEN 5-325 MG PO TABS
1.0000 | ORAL_TABLET | Freq: Once | ORAL | Status: AC
  Administered 2013-12-17: 1 via ORAL
  Filled 2013-12-17: qty 1

## 2013-12-17 MED ORDER — DIBUCAINE 1 % RE OINT: 1.0000 "application " | TOPICAL_OINTMENT | RECTAL | Status: DC | PRN

## 2013-12-17 MED ORDER — EPHEDRINE 5 MG/ML INJ
10.0000 mg | INTRAVENOUS | Status: DC | PRN
  Filled 2013-12-17: qty 4

## 2013-12-17 MED ORDER — TETANUS-DIPHTH-ACELL PERTUSSIS 5-2.5-18.5 LF-MCG/0.5 IM SUSP: 0.5000 mL | Freq: Once | INTRAMUSCULAR | Status: DC

## 2013-12-17 MED ORDER — ONDANSETRON HCL 4 MG/2ML IJ SOLN: 4.0000 mg | INTRAMUSCULAR | Status: DC | PRN

## 2013-12-17 MED ORDER — OXYCODONE-ACETAMINOPHEN 5-325 MG PO TABS
1.0000 | ORAL_TABLET | ORAL | Status: DC | PRN
  Administered 2013-12-17 – 2013-12-19 (×2): 1 via ORAL
  Filled 2013-12-17 (×2): qty 1

## 2013-12-17 MED ORDER — ACETAMINOPHEN 325 MG PO TABS: 650.0000 mg | ORAL_TABLET | ORAL | Status: DC | PRN

## 2013-12-17 MED ORDER — FENTANYL 2.5 MCG/ML BUPIVACAINE 1/10 % EPIDURAL INFUSION (WH - ANES)
INTRAMUSCULAR | Status: DC | PRN
  Administered 2013-12-17: 14 mL/h via EPIDURAL

## 2013-12-17 MED ORDER — ONDANSETRON HCL 4 MG PO TABS: 4.0000 mg | ORAL_TABLET | ORAL | Status: DC | PRN

## 2013-12-17 MED ORDER — LACTATED RINGERS IV SOLN
INTRAVENOUS | Status: DC
  Administered 2013-12-17 (×4): via INTRAVENOUS

## 2013-12-17 MED ORDER — IBUPROFEN 600 MG PO TABS
600.0000 mg | ORAL_TABLET | Freq: Four times a day (QID) | ORAL | Status: DC | PRN
  Administered 2013-12-17: 600 mg via ORAL
  Filled 2013-12-17: qty 1

## 2013-12-17 MED ORDER — LIDOCAINE HCL (PF) 1 % IJ SOLN
INTRAMUSCULAR | Status: DC | PRN
  Administered 2013-12-17 (×2): 9 mL

## 2013-12-17 MED ORDER — LEVOTHYROXINE SODIUM 75 MCG PO TABS
75.0000 ug | ORAL_TABLET | Freq: Every day | ORAL | Status: DC
  Administered 2013-12-18: 75 ug via ORAL
  Filled 2013-12-17 (×2): qty 1

## 2013-12-17 MED ORDER — BENZOCAINE-MENTHOL 20-0.5 % EX AERO
1.0000 "application " | INHALATION_SPRAY | CUTANEOUS | Status: DC | PRN
  Administered 2013-12-17: 1 via TOPICAL
  Filled 2013-12-17 (×2): qty 56

## 2013-12-17 MED ORDER — LIDOCAINE HCL (PF) 1 % IJ SOLN
30.0000 mL | INTRAMUSCULAR | Status: DC | PRN
  Administered 2013-12-17: 30 mL via SUBCUTANEOUS
  Filled 2013-12-17 (×2): qty 30

## 2013-12-17 NOTE — MAU Note (Signed)
Pt returned for labor check.pt states "I can't take it anymore.

## 2013-12-17 NOTE — Progress Notes (Signed)
Patient ID: Cathy Daniels, female   DOB: 10-19-1986, 27 y.o.   MRN: [id number] Pt was started on pitocin and is on 3 mu/ minute. The cervix is 6 cm 100% effaced and the vertex is at - 2 station AROM produced clear fluid.  [name]

## 2013-12-17 NOTE — Anesthesia Preprocedure Evaluation (Signed)
Anesthesia Evaluation  Patient identified by MRN, date of birth, ID band Patient awake    Reviewed: Allergy & Precautions, H&P , NPO status , Patient's Chart, lab work & pertinent test results  Airway Mallampati: II TM Distance: >3 FB Neck ROM: full    Dental no notable dental hx.    Pulmonary    Pulmonary exam normal       Cardiovascular negative cardio ROS      Neuro/Psych negative neurological ROS  negative psych ROS   GI/Hepatic Neg liver ROS,   Endo/Other  diabetes, Well Controlled, Gestational, Insulin Dependent and Oral Hypoglycemic Agents  Renal/GU negative Renal ROS     Musculoskeletal   Abdominal Normal abdominal exam  (+)   Peds  Hematology   Anesthesia Other Findings   Reproductive/Obstetrics (+) Pregnancy                           Anesthesia Physical Anesthesia Plan  ASA: III  Anesthesia Plan: Epidural   Post-op Pain Management:    Induction:   Airway Management Planned:   Additional Equipment:   Intra-op Plan:   Post-operative Plan:   Informed Consent: I have reviewed the patients History and Physical, chart, labs and discussed the procedure including the risks, benefits and alternatives for the proposed anesthesia with the patient or authorized representative who has indicated his/her understanding and acceptance.     Plan Discussed with:   Anesthesia Plan Comments:         Anesthesia Quick Evaluation

## 2013-12-17 NOTE — Progress Notes (Signed)
Patient ID: Cathy Daniels, female   DOB: 1986-05-28, 27 y.o.   MRN: [id number] [name] is 9+ cm with a very small fragment on her right and anteriorly. Will recheck at 1PM and see if she has reached full dilatation.

## 2013-12-17 NOTE — Progress Notes (Addendum)
Dr. Ambrose Mantle discussed risk and benefits of vacuum assisted delivery with patient. Patient verbalizes and agrees to proceed with vacuum assisted delivery. See doctor's delivery note.

## 2013-12-17 NOTE — Progress Notes (Signed)
Patient ID: Cathy Daniels, female   DOB: Aug 24, 1986, 27 y.o.   MRN: [id number] Delivery note  The pt pushed well but made poor progress so after over 2 hours of pushing and showing a dime's worth of caput, the pt was counseled about a vacuum and a mityvac bell cup vacuum was placed and after 2 pop offs and a LML episiotomy a living female infant with weight of 6 pounds 13 ounces delivered ROA. Apgars were 9 and 9 at 1 and 5 minutes. The placenta was intact and the uterus was normal. There were lacerations in the right vagina and in the ML and the LML episiotomy was cut. All lacerations were repaired with 3-0 vicryl. Rectal was negative EBL 400 cc's.

## 2013-12-17 NOTE — Progress Notes (Addendum)
Latex foley catheter removed from patient due to latex allergy. Latex free foley catheter inserted. Dr. Ambrose Mantle notified.

## 2013-12-17 NOTE — Anesthesia Procedure Notes (Signed)
Epidural Patient location during procedure: OB Start time: 12/17/2013 4:00 AM End time: 12/17/2013 4:04 AM  Staffing Anesthesiologist: Leilani Able Performed by: anesthesiologist   Preanesthetic Checklist Completed: patient identified, surgical consent, pre-op evaluation, timeout performed, IV checked, risks and benefits discussed and monitors and equipment checked  Epidural Patient position: sitting Prep: site prepped and draped and DuraPrep Patient monitoring: continuous pulse ox and blood pressure Approach: midline Injection technique: LOR air  Needle:  Needle type: Tuohy  Needle gauge: 17 G Needle length: 9 cm and 9 Needle insertion depth: 6 cm Catheter type: closed end flexible Catheter size: 19 Gauge Catheter at skin depth: 11 cm Test dose: negative and Other  Assessment Sensory level: T9 Events: blood not aspirated, injection not painful, no injection resistance, negative IV test and no paresthesia  Additional Notes Reason for block:procedure for pain

## 2013-12-17 NOTE — H&P (Signed)
Cathy Daniels, Cathy Daniels NO.:  [id number]  MEDICAL RECORD NO.:  [id number]  LOCATION:  9166                          FACILITY:  [facility]  PHYSICIAN:  Malachi Pro. Ambrose Mantle, M.D. DATE OF BIRTH:  Sep 04, 1986  DATE OF ADMISSION:  12/17/2013 DATE OF DISCHARGE:                             HISTORY & PHYSICAL   HISTORY OF PRESENT ILLNESS:  This is a 27 year old female, para 0, gravida 1, with EDC on December 22, 2013, admitted with early labor. The patient had been to the maternity admission unit 3 times on the day of admission and finally her cervix changed from 1 cm to 3 cm.  Blood group and type B positive, negative antibody.  Pap smear normal. Rubella immune.  RPR nonreactive.  Urine culture negative.  Hepatitis B surface antigen negative.  HIV negative.  GC and Chlamydia negative. Cystic fibrosis negative.  First trimester screen and AFP normal.  Group B strep negative.  TSH 1.53.  The patient began her prenatal course at 12 weeks and 6 days.  She was diagnosed with type 2 diabetes in January 2013.  Her diabetes was managed with diet alone.  Her last hemoglobin A1c at that time was 5.7 at the time of her early visits.  Dr. Talmage Nap placed her on metformin 500 mg every night.  Blood pressures remained a little bit high at 32 weeks.  Her fasting blood sugars were 105-139, post prandials 114-163.  Her metformin had been increased to 2 in the morning and 2 in the evening.  The patient was tested with nonstress test twice weekly at 34 weeks and 5 days.  The patient was on insulin 14 units of NPH every night as well as her metformin.  The insulin was increased to 18 units at night at 37 weeks.  The patient began contracting today.  She came to the maternity admission unit 3 times and on the third visit, her cervix changed according to the nurse from 1 cm to 3 cm.  ALLERGIES:  She is allergic to latex.  She gets hives.  She has allergy to sulfa, it causes hives.  PAST MEDICAL HISTORY:   Diabetes type 2, hypothyroidism, anemia, and asthma.  SURGICAL HISTORY:  None.  SOCIAL HISTORY:  She never smoked.  No alcohol intake.  No drug use. She is a [place], born in [place], 4 years of college.  PHYSICAL EXAMINATION:  VITAL SIGNS:  On admission, blood pressure 126/73, pulse is 74, respirations 18, temperature 97.8. HEART:  Normal size and sounds.  No murmurs. LUNGS:  Clear to auscultation. ABDOMEN:  Soft.  Fundal height at her last prenatal visit on December 13, 2013, was 38 cm.  Fetal heart tones are normal.  The cervix according to the MAU nurse is 3 cm, 90%, vertex presentation.  ADMITTING IMPRESSION:  Intrauterine pregnancy at 39 weeks, type 2 diabetes, fairly well controlled on metformin and insulin, hypothyroidism.  The patient is admitted, and she is requesting to take her pain away and she wants an epidural.     Malachi Pro. Ambrose Mantle, M.D.     TFH/MEDQ  D:  12/17/2013  T:  12/17/2013  Job:  [id number]

## 2013-12-17 NOTE — Lactation Note (Signed)
This note was copied from the chart of Girl Tavi Gaughran. Lactation Consultation Note  Patient Name: Girl Eowyn Tabone [id number] Date: 12/17/2013 Reason for consult: Initial assessment Mom reports baby is not latching to left breast. Demonstrated hand expression, some colostrum present. Baby is a tongue thruster, demonstrated suck training to Mom. With [facility] assist, baby latched to left breast in football hold. She demonstrated a good rhythmic suck with some swallows noted. Demonstrated hand pump to Mom and advised to pre-pump to help with latch. BF basics reviewed. Lactation brochure left for review. Advised of OP services and support group. Advised to call for assist as needed with latch.   Maternal Data Formula Feeding for Exclusion: No Infant to breast within first hour of birth: No Breastfeeding delayed due to:: Maternal status Has patient been taught Hand Expression?: Yes Does the patient have breastfeeding experience prior to this delivery?: No  Feeding Feeding Type: Breast Fed  LATCH Score/Interventions Latch: Repeated attempts needed to sustain latch, nipple held in mouth throughout feeding, stimulation needed to elicit sucking reflex. Intervention(s): Adjust position;Assist with latch;Breast massage;Breast compression  Audible Swallowing: A few with stimulation  Type of Nipple: Everted at rest and after stimulation  Comfort (Breast/Nipple): Soft / non-tender     Hold (Positioning): Assistance needed to correctly position infant at breast and maintain latch. Intervention(s): Breastfeeding basics reviewed;Support Pillows;Position options;Skin to skin  LATCH Score: 7  Lactation Tools Discussed/Used Tools: Pump Breast pump type: Manual   Consult Status Consult Status: Follow-up Date: 12/18/13 Follow-up type: In-patient    Alfred Levins 12/17/2013, 11:46 PM

## 2013-12-18 LAB — GLUCOSE, CAPILLARY
Glucose-Capillary: 99 mg/dL (ref 70–99)
Glucose-Capillary: 86 mg/dL (ref 70–99)
Glucose-Capillary: 112 mg/dL — ABNORMAL HIGH (ref 70–99)

## 2013-12-18 LAB — CBC
Hemoglobin: 8.1 g/dL — ABNORMAL LOW (ref 12.0–15.0)
MCHC: 31.8 g/dL (ref 30.0–36.0)
RBC: 3.53 MIL/uL — ABNORMAL LOW (ref 3.87–5.11)

## 2013-12-18 NOTE — Anesthesia Postprocedure Evaluation (Signed)
Anesthesia Post Note  Patient: Cathy Daniels  Procedure(s) Performed: * No procedures listed *  Anesthesia type: Epidural  Patient location: Mother/Baby  Post pain: Pain level controlled  Post assessment: Post-op Vital signs reviewed  Last Vitals:  Filed Vitals:   12/18/13 0550  BP: 118/80  Pulse: 80  Temp: 36.4 C  Resp: 18    Post vital signs: Reviewed  Level of consciousness:alert  Complications: No apparent anesthesia complications

## 2013-12-18 NOTE — Lactation Note (Signed)
This note was copied from the chart of Cathy Roshell Brigham. Lactation Consultation Note Mom request LC assistance, states baby has not fed well; states does not latch. Baby now 20 hours old.  Attempted to latch baby, but no latch. Assessed baby's suck, baby has unorganized suck with tongue thrusting.  Discussed feeding options with mom, and developed a feeding plan together with mom.   Nipple shield initiated to assist baby in lowering the tongue. Initiated DEP to provide for a supplement and to stimulate milk production.  Mom pumped, followed by hand expression, and expressed one drop, which mom fed to baby off her finger. Attempted again to latch baby to the nipple shield, but no latch. Enc mom to allow herself and her baby to rest for a few minutes, and try again.  Discussed the possibility of using a small amount of formula through the nipple shield; mom agrees, wanting baby to be fed something. Inst mom to attempt one more feeding within the hour then consider formula.  Mom to call if she has any concerns.   Patient Name: Cathy Daniels [id number] Date: 12/18/2013 Reason for consult: Initial assessment   Maternal Data Has patient been taught Hand Expression?: Yes Does the patient have breastfeeding experience prior to this delivery?: No  Feeding Feeding Type: Breast Fed Length of feed: 2 min (with formula in nipple shield-latched 2 times)  LATCH Score/Interventions Latch: Repeated attempts needed to sustain latch, nipple held in mouth throughout feeding, stimulation needed to elicit sucking reflex. Intervention(s): Adjust position;Assist with latch;Breast massage  Audible Swallowing: None Intervention(s): Skin to skin;Hand expression  Type of Nipple: Everted at rest and after stimulation  Comfort (Breast/Nipple): Soft / non-tender     Hold (Positioning): Full assist, staff holds infant at breast  LATCH Score: 5  Lactation Tools Discussed/Used     Consult  Status Consult Status: Follow-up Follow-up type: In-patient    Octavio Manns [facility] 12/18/2013, 1:53 PM

## 2013-12-18 NOTE — Progress Notes (Signed)
PPD #1 No problems, sore Afeb, VSS, CBG all <180 Fundus firm, NT at U-1 Continue routine postpartum care, check CBG fasting and 1 hr pp

## 2013-12-19 LAB — GLUCOSE, CAPILLARY: Glucose-Capillary: 83 mg/dL (ref 70–99)

## 2013-12-19 MED ORDER — OXYCODONE-ACETAMINOPHEN 5-325 MG PO TABS: 1.0000 | ORAL_TABLET | ORAL | Status: DC | PRN

## 2013-12-19 MED ORDER — IBUPROFEN 600 MG PO TABS: 600.0000 mg | ORAL_TABLET | Freq: Four times a day (QID) | ORAL | Status: DC

## 2013-12-19 NOTE — Lactation Note (Addendum)
This note was copied from the chart of Girl Elianis Fischbach. Lactation Consultation Note: Mom reports that baby is doing much better today. She has been able to latch baby without the NS several times during the night. Reports that baby just fed for about 30 minutes. She started with the NS and then ended the feeding without the NS. Baby asleep on mom's chest. Mom pumped 4 times yesterday and obtains a few cc's each pumping.  States she feels much more comfortable with feedings. Plans to call [facility] about a pump- has manual pump for home sue. Encouraged to call for OP appointment as needed. No further questions at present. To call prn  Patient Name: Girl Shinita Mac [id number] Date: 12/19/2013 Reason for consult: Follow-up assessment   Maternal Data    Feeding    LATCH Score/Interventions                      Lactation Tools Discussed/Used WIC Program: Yes   Consult Status Consult Status: Complete    Pamelia Hoit 12/19/2013, 9:36 AM

## 2013-12-19 NOTE — Discharge Summary (Signed)
Obstetric Discharge Summary Reason for Admission: onset of labor Prenatal Procedures: NST Intrapartum Procedures: vacuum Postpartum Procedures: none Complications-Operative and Postpartum: LML episiotomy, right vaginal and midline lacerations Hemoglobin  Date Value Range Status  12/18/2013 8.1* 12.0 - 15.0 g/dL Final     REPEATED TO VERIFY     DELTA CHECK NOTED     HCT  Date Value Range Status  12/18/2013 25.5* 36.0 - 46.0 % Final    Physical Exam:  General: alert Lochia: appropriate Uterine Fundus: firm  Discharge Diagnoses: Term Pregnancy-delivered and Type II DM  Discharge Information: Date: 12/19/2013 Activity: pelvic rest Diet: routine Medications: Ibuprofen and Percocet Condition: stable Instructions: refer to practice specific booklet Discharge to: home Follow-up Information   Follow up with Bing Plume, MD. Schedule an appointment as soon as possible for a visit in 6 weeks.   Specialty:  Obstetrics and Gynecology   Contact information:   [address], [address], [address] [place] [address] [phone]       Newborn Data: Live born female  Birth Weight: 6 lb 13 oz (3090 g) APGAR: 9, 9  Home with mother.  Alfhild Partch D 12/19/2013, 10:22 AM

## 2013-12-19 NOTE — Progress Notes (Signed)
PPD #2 No problems Afeb, VSS, CBG all ok Fundus firm, NT at U-1 Continue routine postpartum care, d/c home

## 2014-10-24 ENCOUNTER — Encounter (HOSPITAL_COMMUNITY): Payer: Self-pay | Admitting: *Deleted

## 2015-01-12 ENCOUNTER — Other Ambulatory Visit: Payer: Self-pay | Admitting: Physician Assistant

## 2015-01-12 ENCOUNTER — Other Ambulatory Visit (HOSPITAL_COMMUNITY)
Admission: RE | Admit: 2015-01-12 | Discharge: 2015-01-12 | Disposition: A | Discharge status: Home / Self Care | Payer: BLUE CROSS/BLUE SHIELD | Source: Ambulatory Visit | Attending: Physician Assistant | Admitting: Physician Assistant

## 2015-01-12 DIAGNOSIS — Z124 Encounter for screening for malignant neoplasm of cervix: Secondary | ICD-10-CM | POA: Diagnosis present

## 2015-01-12 DIAGNOSIS — Z1151 Encounter for screening for human papillomavirus (HPV): Secondary | ICD-10-CM | POA: Diagnosis present

## 2015-01-16 LAB — CYTOLOGY - PAP

## 2015-02-03 ENCOUNTER — Other Ambulatory Visit: Payer: Self-pay | Admitting: Obstetrics & Gynecology

## 2015-02-03 NOTE — Telephone Encounter (Signed)
I'm sorry but this needs to come from her PCP as we haven't seen or done an exam on her in several years.  Rx declined.

## 2015-02-03 NOTE — Telephone Encounter (Signed)
Patient is asking for a refill of Naproxen for cramps[facility] [address] Last seen 12/10/13.

## 2015-02-03 NOTE — Telephone Encounter (Signed)
Medication refill request: Naproxen  Last AEX:  ?   Next AEX: Not scheduled  Last MMG (if hormonal medication request): none  Refill authorized: ?   Called patient to scheduled AEX. She states she had [facility]. She states the only reason she didn't come here is because we don't take her insurance.

## 2015-02-06 NOTE — Telephone Encounter (Signed)
Patient notified.  Verbalized understanding. 

## 2015-03-25 IMAGING — US US OB NUCHAL TRANSLUCENCY 1ST GEST
1 series · 13 of 28 positions shown · non-contrast
Comparison: none

[Series 1: us ob nuchal translucency 1st gest · 0.15mm/px · 13 of 30 slices shown]
[im 2/30]
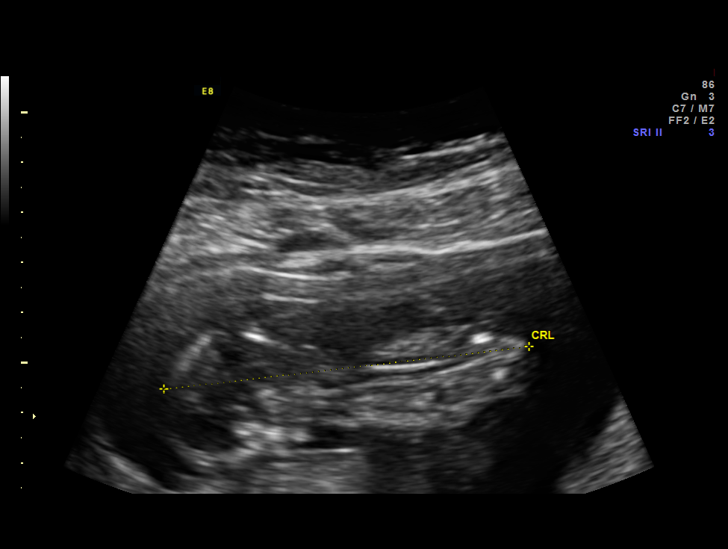
[im 4/30]
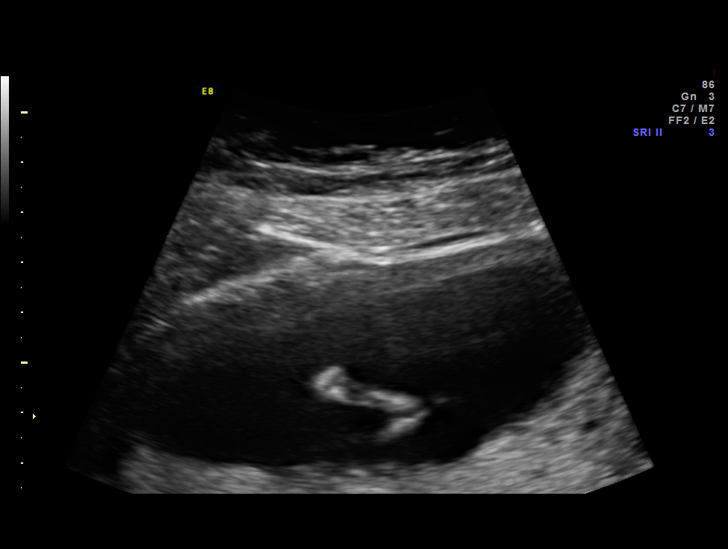
[im 6/30]
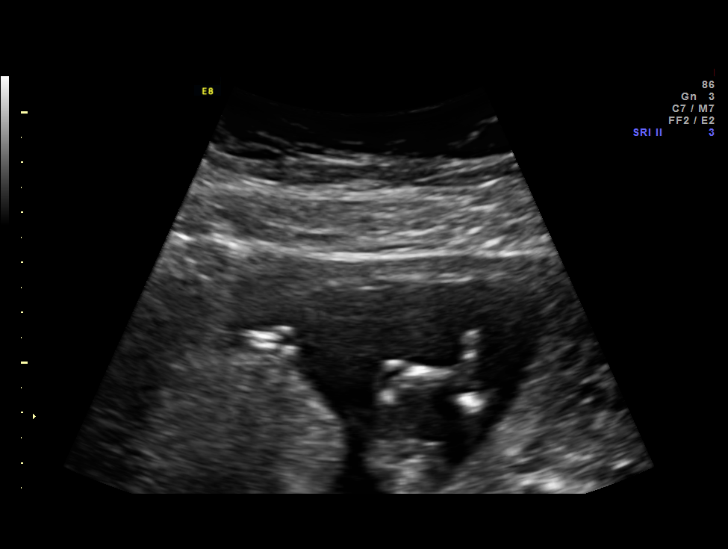
[im 8/30]
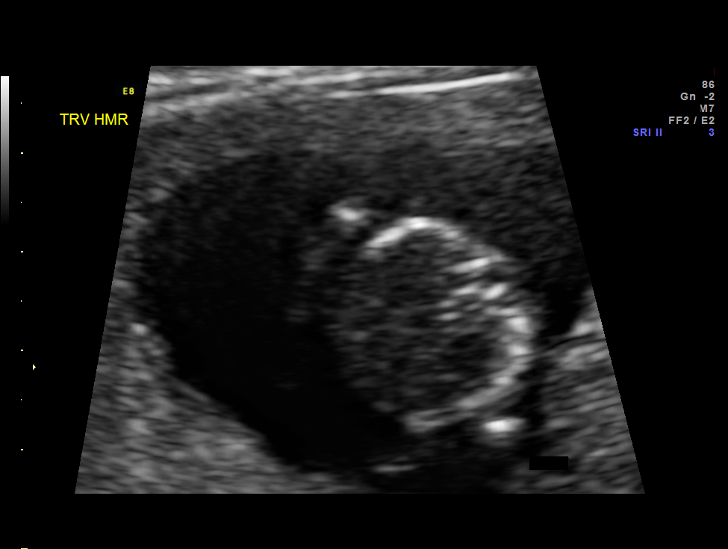
[im 10/30]
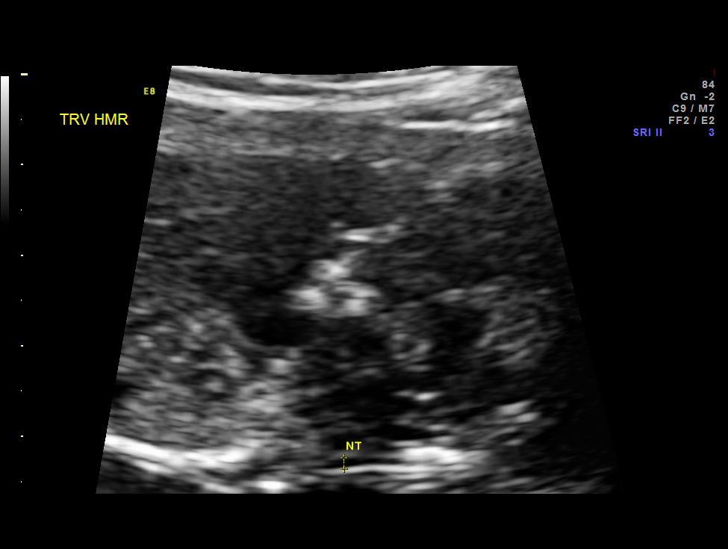
[im 12/30]
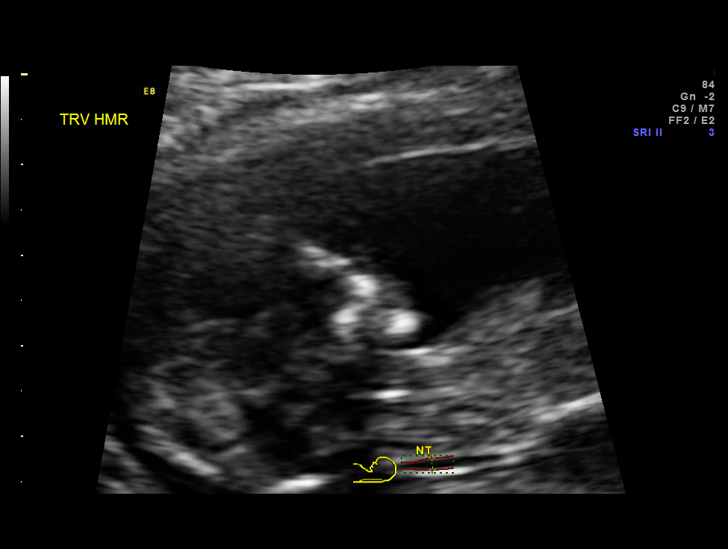
[im 16/30]
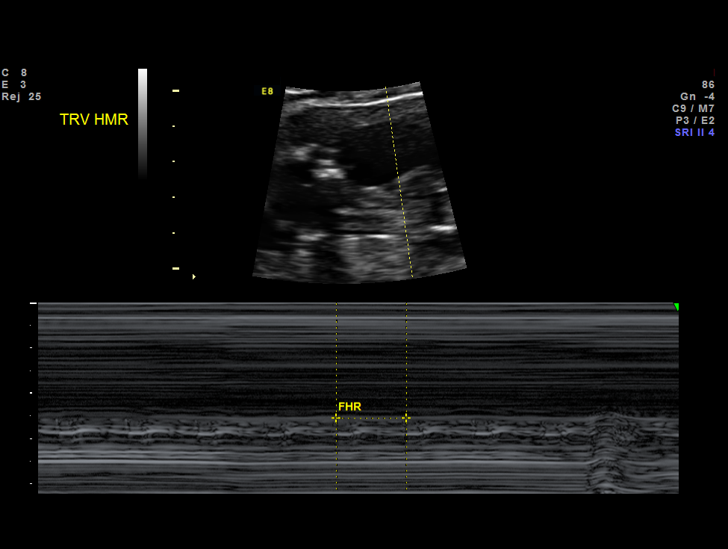
[im 18/30]
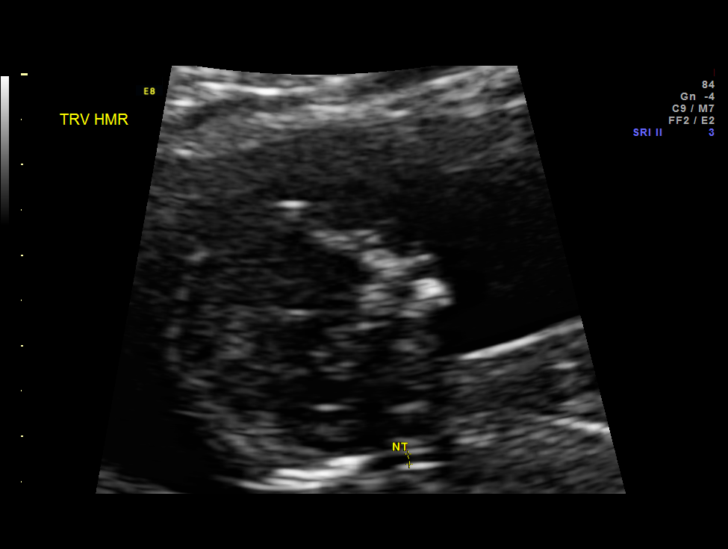
[im 20/30]
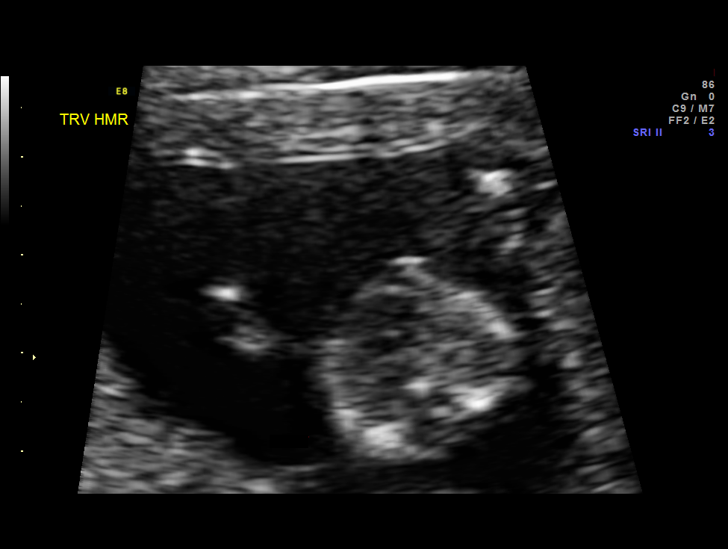
[im 22/30]
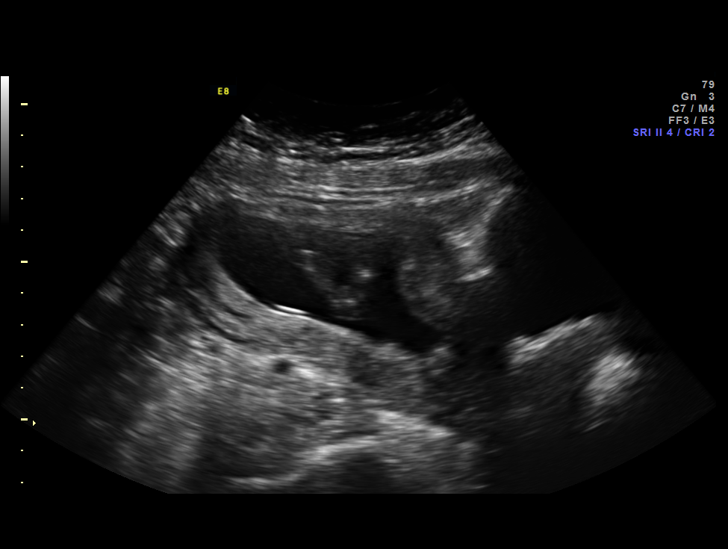
[im 24/30]
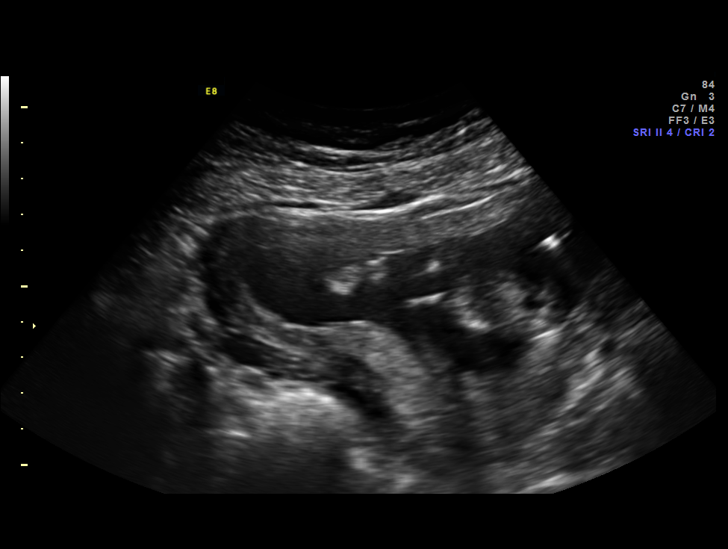
[im 26/30]
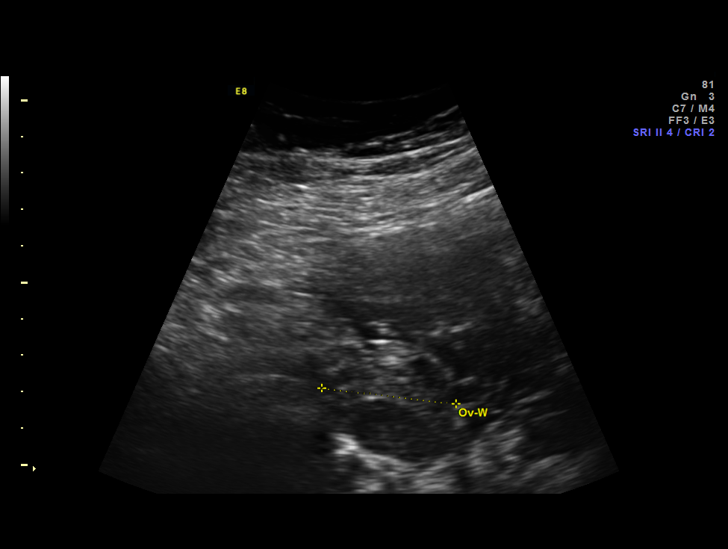
[im 28/30]
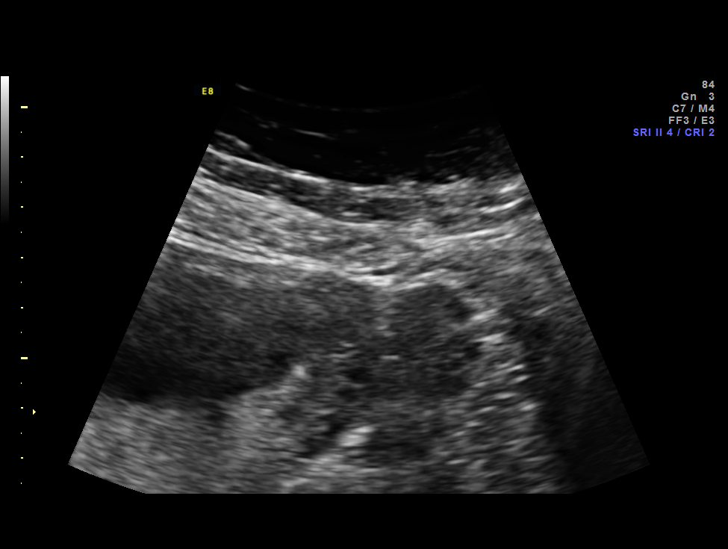

[13 of 28 positions shown; findings below may reference images not displayed]

OBSTETRICS REPORT
                      (Signed Final 06/18/2013 [DATE])

Service(s) Provided

 US FETAL NUCHAL TRANSLUCENCY                          76813.0
 MEASUREMENT
Indications

 First trimester aneuploidy screen (NT)
Fetal Evaluation

 Num Of Fetuses:    1
 Preg. Location:    Intrauterine
 Fetal Heart Rate:  144                          bpm
 Cardiac Activity:  Observed
 Presentation:      Variable
 Placenta:          Gestational age too early
                    to evaluate
Biometry

 CRL:     75.9  mm     G. Age:  13w 3d                 EDD:    12/21/13
 NT:       1.5  mm
Gestational Age

 LMP:           14w 1d        Date:  03/11/13                 EDD:   12/16/13
 Best:          13w 2d     Det. By:  Early Exam  (05/10/13)   EDD:   12/22/13
1st Trimester Genetic Sonogram Screening

 CRL:            75.9  mm    G. Age:   13w 3d                 EDD:   12/21/13
 Nuc Trans:       2.0  mm
Cervix Uterus Adnexa

 Cervix:       Normal appearance by transabdominal scan.
 Uterus:       No abnormality visualized.
 Cul De Sac:   No free fluid seen.
 Left Ovary:    Size(cm) L: 2 x W: 1.93 x H: 2.56  Volume(cc):
                Within normal limits.
 Right Ovary:   Size(cm) L: 3.69 x W: 3.71 x H: 2.97  Volume(cc):
                21.3 Within normal limits.
 Adnexa:     No abnormality visualized.
Impression

 Single IUP at 13 [DATE] weeks
 Normal NT (2.0 mm)  Nasal bone visualized
 First trimester aneuploidy screen performed as noted above.
Recommendations

  Please do not draw triple/quad screen, though patient should
 be offered MSAFP for neural tube defect screening.
 Recommend ultrasound for fetal anatomy at 18 weeks.

 questions or concerns.

## 2015-12-24 DIAGNOSIS — B999 Unspecified infectious disease: Secondary | ICD-10-CM

## 2015-12-24 HISTORY — DX: Unspecified infectious disease: B99.9

## 2016-01-15 DIAGNOSIS — R7303 Prediabetes: Secondary | ICD-10-CM | POA: Diagnosis not present

## 2016-01-15 DIAGNOSIS — L301 Dyshidrosis [pompholyx]: Secondary | ICD-10-CM | POA: Diagnosis not present

## 2016-01-15 DIAGNOSIS — Z0000 Encounter for general adult medical examination without abnormal findings: Secondary | ICD-10-CM | POA: Diagnosis not present

## 2016-01-15 DIAGNOSIS — D509 Iron deficiency anemia, unspecified: Secondary | ICD-10-CM | POA: Diagnosis not present

## 2016-01-15 DIAGNOSIS — E039 Hypothyroidism, unspecified: Secondary | ICD-10-CM | POA: Diagnosis not present

## 2016-01-15 DIAGNOSIS — N946 Dysmenorrhea, unspecified: Secondary | ICD-10-CM | POA: Diagnosis not present

## 2016-01-15 DIAGNOSIS — J4520 Mild intermittent asthma, uncomplicated: Secondary | ICD-10-CM | POA: Diagnosis not present

## 2016-01-15 MED FILL — BETAMETHASONE DP 0.05% CRM: 0.05 | 10 days supply | Qty: 30 | Fill #0

## 2016-02-27 MED FILL — LEVOTHYROXINE 75 MCG TABLET: 75 | 90 days supply | Qty: 90 | Fill #0

## 2016-04-19 DIAGNOSIS — J069 Acute upper respiratory infection, unspecified: Secondary | ICD-10-CM | POA: Diagnosis not present

## 2016-04-19 DIAGNOSIS — F419 Anxiety disorder, unspecified: Secondary | ICD-10-CM | POA: Diagnosis not present

## 2016-04-19 DIAGNOSIS — J309 Allergic rhinitis, unspecified: Secondary | ICD-10-CM | POA: Diagnosis not present

## 2016-04-19 DIAGNOSIS — J45909 Unspecified asthma, uncomplicated: Secondary | ICD-10-CM | POA: Diagnosis not present

## 2016-04-19 DIAGNOSIS — J320 Chronic maxillary sinusitis: Secondary | ICD-10-CM | POA: Diagnosis not present

## 2016-04-19 DIAGNOSIS — R05 Cough: Secondary | ICD-10-CM | POA: Diagnosis not present

## 2016-04-19 DIAGNOSIS — G4700 Insomnia, unspecified: Secondary | ICD-10-CM | POA: Diagnosis not present

## 2016-04-19 MED FILL — FLUTICASONE PROP 50 MCG SPR: 50 | 30 days supply | Qty: 16 | Fill #0

## 2016-04-19 MED FILL — DOXYCYCLINE HYCLATE 100 MG: 100 | 10 days supply | Qty: 20 | Fill #0

## 2016-04-19 MED FILL — GUAIATUSSIN AC LIQUID: 100-10 | 4 days supply | Qty: 120 | Fill #0

## 2016-06-21 DIAGNOSIS — H52222 Regular astigmatism, left eye: Secondary | ICD-10-CM | POA: Diagnosis not present

## 2016-06-21 DIAGNOSIS — H5213 Myopia, bilateral: Secondary | ICD-10-CM | POA: Diagnosis not present

## 2016-08-02 MED FILL — LEVOTHYROXINE 75 MCG TABLET: 75 | 90 days supply | Qty: 90 | Fill #1

## 2016-12-10 MED FILL — LEVOTHYROXINE 75 MCG TABLET: 75 | 90 days supply | Qty: 90 | Fill #2

## 2017-01-28 ENCOUNTER — Encounter: Payer: Self-pay | Admitting: Osteopathic Medicine

## 2017-01-28 ENCOUNTER — Ambulatory Visit (INDEPENDENT_AMBULATORY_CARE_PROVIDER_SITE_OTHER): Payer: 59 | Admitting: Osteopathic Medicine

## 2017-01-28 VITALS — BP 128/69 | HR 71 | Ht 63.5 in | Wt 225.0 lb

## 2017-01-28 DIAGNOSIS — E039 Hypothyroidism, unspecified: Secondary | ICD-10-CM | POA: Diagnosis not present

## 2017-01-28 DIAGNOSIS — Z8709 Personal history of other diseases of the respiratory system: Secondary | ICD-10-CM

## 2017-01-28 DIAGNOSIS — Z8632 Personal history of gestational diabetes: Secondary | ICD-10-CM

## 2017-01-28 DIAGNOSIS — Z87898 Personal history of other specified conditions: Secondary | ICD-10-CM | POA: Insufficient documentation

## 2017-01-28 DIAGNOSIS — E559 Vitamin D deficiency, unspecified: Secondary | ICD-10-CM

## 2017-01-28 DIAGNOSIS — F418 Other specified anxiety disorders: Secondary | ICD-10-CM

## 2017-01-28 DIAGNOSIS — N946 Dysmenorrhea, unspecified: Secondary | ICD-10-CM | POA: Insufficient documentation

## 2017-01-28 DIAGNOSIS — L309 Dermatitis, unspecified: Secondary | ICD-10-CM

## 2017-01-28 DIAGNOSIS — D649 Anemia, unspecified: Secondary | ICD-10-CM

## 2017-01-28 DIAGNOSIS — N926 Irregular menstruation, unspecified: Secondary | ICD-10-CM | POA: Insufficient documentation

## 2017-01-28 LAB — COMPLETE METABOLIC PANEL WITH GFR
ALT: 9 U/L (ref 6–29)
AST: 13 U/L (ref 10–30)
Albumin: 4.2 g/dL (ref 3.6–5.1)
Alkaline Phosphatase: 59 U/L (ref 33–115)
BUN: 12 mg/dL (ref 7–25)
CHLORIDE: 108 mmol/L (ref 98–110)
CO2: 22 mmol/L (ref 20–31)
CREATININE: 0.68 mg/dL (ref 0.50–1.10)
Calcium: 8.9 mg/dL (ref 8.6–10.2)
GFR, Est African American: >89 mL/min (ref >=60)
GFR, Est Non African American: >89 mL/min (ref >=60)
Glucose, Bld: 99 mg/dL (ref 65–99)
Potassium: 4.3 mmol/L (ref 3.5–5.3)
SODIUM: 138 mmol/L (ref 135–146)
Total Bilirubin: 0.5 mg/dL (ref 0.2–1.2)
Total Protein: 6.8 g/dL (ref 6.1–8.1)

## 2017-01-28 LAB — LIPID PANEL
Cholesterol: 121 mg/dL (ref <200)
HDL: 40 mg/dL — ABNORMAL LOW (ref >50)
LDL CALC: 59 mg/dL (ref <100)
Total CHOL/HDL Ratio: 3 Ratio (ref <5.0)
Triglycerides: 108 mg/dL (ref <150)
VLDL: 22 mg/dL (ref <30)

## 2017-01-28 LAB — TSH: TSH: 4.87 mIU/L — ABNORMAL HIGH

## 2017-01-28 LAB — CBC WITH DIFFERENTIAL/PLATELET
BASOS PCT: 1 %
Basophils Absolute: 75 cells/uL (ref 0–200)
EOS PCT: 6 %
Eosinophils Absolute: 450 cells/uL (ref 15–500)
HCT: 32.8 % — ABNORMAL LOW (ref 35.0–45.0)
Hemoglobin: 9.7 g/dL — ABNORMAL LOW (ref 11.7–15.5)
LYMPHS PCT: 33 %
Lymphs Abs: 2475 cells/uL (ref 850–3900)
MCH: 20.9 pg — ABNORMAL LOW (ref 27.0–33.0)
MCHC: 29.6 g/dL — ABNORMAL LOW (ref 32.0–36.0)
MCV: 70.7 fL — ABNORMAL LOW (ref 80.0–100.0)
MONOS PCT: 4 %
MPV: 9.8 fL (ref 7.5–12.5)
Monocytes Absolute: 300 cells/uL (ref 200–950)
Neutro Abs: 4200 cells/uL (ref 1500–7800)
Neutrophils Relative %: 56 %
PLATELETS: 464 10*3/uL — AB (ref 140–400)
RBC: 4.64 MIL/uL (ref 3.80–5.10)
RDW: 16.7 % — AB (ref 11.0–15.0)
WBC: 7.5 10*3/uL (ref 3.8–10.8)

## 2017-01-28 LAB — HEMOGLOBIN A1C
Hgb A1c MFr Bld: 5.7 % — ABNORMAL HIGH (ref <5.7)
Mean Plasma Glucose: 117 mg/dL

## 2017-01-28 NOTE — Progress Notes (Signed)
HPI: Cathy Daniels is a 31 y.o. female  who [facility], 01/28/17,  for chief complaint of:  Chief Complaint  Patient presents with  . Establish Care    ANNUAL, EAR PAIN, CUTS ON FINGER    Anxiety - new issue Was having trouble passing NCLEX, text anxiety. Then passed the test so never filled any Rx. Currently situational(?) anxiety w/ a coworker, work stress.   Hands: Cracking skin, previously prescribed steroid cream, thinks dexamethasone. Only using daily not bid d/t sleep problems. 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   Ear pain: L ear, feels like pressure/popping, ongoing about a week, hasn't taken any OTC medications. Feels a bit better today   Hypothyroid: Hasn't had thyroid checked in awhile.   Hist Gestational DM and pre-DM unrelated to pregnancy. Previously seen by endocrinology.   Asthma: Pulmicort as needed, diagnosed in 2008-2009. Has only used in once in the last year or so. Albuterol has sulfa so can't use this.   Periods: Naproxen as needed for cramps. Lately have had irregular periods, usually coming late. Always had some irregular periods but had one late period by 6 weeks but abstinent.      Past medical, surgical, social and family history reviewed: Patient Active Problem List   Diagnosis Date Noted  . Pregnancy 12/17/2013  . Vaginal delivery 12/17/2013  . ALLERGIC RHINITIS CAUSE UNSPECIFIED 03/30/2010  . RASH AND OTHER NONSPECIFIC SKIN ERUPTION 03/30/2010  . Asthma, moderate persistent 04/19/2008   Past Surgical History:  Procedure Laterality Date  . COLPOSCOPY  3/07, 8/07, 1/08   CIN II, benign, CIN I  . NO PAST SURGERIES     Social History  Substance Use Topics  . Smoking status: Never Smoker  . Smokeless tobacco: Never Used  . Alcohol use No   Family History  Problem Relation Age of Onset  . Diabetes Father   . Hypertension Mother   . Diabetes      PU x 5, PA x 1  . Glaucoma Maternal  Grandmother   . Hypertension Maternal Grandfather   . Hearing loss Neg Hx      Current medication list and allergy/intolerance information reviewed:   Current Outpatient Prescriptions  Medication Sig Dispense Refill  . budesonide (PULMICORT) 180 MCG/ACT inhaler Inhale 1 puff into the lungs 2 (two) times daily as needed (For asthma).    . ibuprofen (ADVIL,MOTRIN) 600 MG tablet Take 1 tablet (600 mg total) by mouth every 6 (six) hours. 30 tablet 0  . levothyroxine (SYNTHROID, LEVOTHROID) 75 MCG tablet Take 75 mcg by mouth daily before breakfast.     No current facility-administered medications for this visit.    Allergies  Allergen Reactions  . Ferrous Sulfate Hives    Repliva OK  . Sulfonamide Derivatives Hives  . Latex Hives, Itching and Rash    Itching and rash when wearing latex gloves      Review of Systems:  Constitutional:  No  fever, no chills, No recent illness, No unintentional weight changes. No significant fatigue.   HEENT: No  headache, no vision change, no hearing change, No sore throat, No  sinus pressure  Cardiac: No  chest pain, No  pressure, No palpitations, No  Orthopnea  Respiratory:  No  shortness of breath. No  Cough  Gastrointestinal: No  abdominal pain, No  nausea, No  vomiting,  No  blood in stool, No  diarrhea, No  constipation   Musculoskeletal: No new myalgia/arthralgia  Genitourinary: No  incontinence, No  abnormal genital bleeding, No abnormal genital discharge  Skin: No  Rash, No other wounds/concerning lesions  Hem/Onc: No  easy bruising/bleeding, No  abnormal lymph node  Endocrine: No cold intolerance,  No heat intolerance. No polyuria/polydipsia/polyphagia   Neurologic: No  weakness, No  dizziness, No  slurred speech/focal weakness/facial droop  Psychiatric: No  concerns with depression, +concerns with anxiety, No sleep problems, No mood problems  Exam:  BP 128/69   Pulse 71   Ht 5' 3.5" (1.613 m)   Wt 225 lb (102.1 kg)   BMI  39.23 kg/m   Constitutional: VS see above. General Appearance: alert, well-developed, well-nourished, NAD  Eyes: Normal lids and conjunctive, non-icteric sclera  Ears, Nose, Mouth, Throat: MMM, Normal external inspection ears/nares/mouth/lips/gums. TM normal bilaterally. Pharynx/tonsils no erythema, no exudate. Nasal mucosa normal.   Neck: No masses, trachea midline. No thyroid enlargement. No tenderness/mass appreciated. No lymphadenopathy  Respiratory: Normal respiratory effort. no wheeze, no rhonchi, no rales  Cardiovascular: S1/S2 normal, no murmur, no rub/gallop auscultated. RRR. No lower extremity edema. Pedal pulse II/IV bilaterally DP and PT. No carotid bruit or JVD. No abdominal aortic bruit.  Gastrointestinal: Nontender, no masses. No hepatomegaly, no splenomegaly. No hernia appreciated. Bowel sounds normal. Rectal exam deferred.   Musculoskeletal: Gait normal. No clubbing/cyanosis of digits.   Neurological: Normal balance/coordination. No tremor.   Skin: warm, dry, intact. No rash/ulcer. Mild dry cracking on palms  Psychiatric: Normal judgment/insight. Normal mood and affect. Oriented x3.      ASSESSMENT/PLAN:   Hypothyroidism, unspecified type - we'll check TSH, refill based on labs - Plan: CBC with Differential/Platelet, COMPLETE METABOLIC PANEL WITH GFR, Lipid panel, TSH, VITAMIN D 25 Hydroxy (Vit-D Deficiency, Fractures)  History of prediabetes - Plan: Hemoglobin A1c  History of gestational diabetes - Plan: Hemoglobin A1c  Irregular periods - Patient reluctant to trial OCP, problems with this in the past. Consider PCOS diagnosis versus uncomplicated abnormal bleeding - Plan: TSH, Hemoglobin A1c  Eczema of both hands - Trial twice a day steroids that she has a home, consider escalation to higher potency  Situational anxiety - Advised counseling, option for medications if needed but patient would like to try counseling first.  History of asthma - Patient states  rare use of Pulmicort, unable to tolerate albuterol. Follow-up as needed for this  Menstrual cramp - Continue naproxen as needed.   Visit summary with medication list and pertinent instructions was printed for patient to review. All questions at time of visit were answered - patient instructed to contact office with any additional concerns. ER/RTC precautions were reviewed with the patient. Follow-up plan: Return in about 1 year (around 01/28/2018) for [employer].

## 2017-01-29 LAB — RETICULOCYTES
ABS Retic: 60580 cells/uL (ref 20000–80000)
RBC.: 4.66 MIL/uL (ref 3.80–5.10)
RETIC CT PCT: 1.3 %

## 2017-01-29 LAB — VITAMIN D 25 HYDROXY (VIT D DEFICIENCY, FRACTURES): Vit D, 25-Hydroxy: 9 ng/mL — ABNORMAL LOW (ref 30–100)

## 2017-01-29 MED ORDER — LEVOTHYROXINE SODIUM 88 MCG PO TABS: 88.0000 ug | ORAL_TABLET | Freq: Every day | ORAL | 0 refills | Status: DC

## 2017-01-29 MED ORDER — VITAMIN D (ERGOCALCIFEROL) 1.25 MG (50000 UNIT) PO CAPS: 50000.0000 [IU] | ORAL_CAPSULE | ORAL | 0 refills | Status: DC

## 2017-01-29 NOTE — Addendum Note (Signed)
Addended by: Deirdre PippinsALEXANDER, Garvis Downum M on: 01/29/2017 08:21 AM   Modules accepted: Orders

## 2017-01-30 ENCOUNTER — Telehealth: Payer: Self-pay | Admitting: Osteopathic Medicine

## 2017-01-30 DIAGNOSIS — D619 Aplastic anemia, unspecified: Secondary | ICD-10-CM

## 2017-01-30 DIAGNOSIS — D649 Anemia, unspecified: Secondary | ICD-10-CM

## 2017-01-30 LAB — IRON AND TIBC
%SAT: 6 % — ABNORMAL LOW (ref 11–50)
IRON: 25 ug/dL — AB (ref 40–190)
TIBC: 425 ug/dL (ref 250–450)
UIBC: 400 ug/dL (ref 125–400)

## 2017-01-30 LAB — FERRITIN: FERRITIN: 7 ng/mL — AB (ref 10–154)

## 2017-01-30 NOTE — Telephone Encounter (Signed)
Needed to correct orders for add-on lab

## 2017-01-31 ENCOUNTER — Other Ambulatory Visit: Payer: Self-pay | Admitting: *Deleted

## 2017-01-31 DIAGNOSIS — E559 Vitamin D deficiency, unspecified: Secondary | ICD-10-CM

## 2017-01-31 MED ORDER — VITAMIN D (ERGOCALCIFEROL) 1.25 MG (50000 UNIT) PO CAPS: 50000.0000 [IU] | ORAL_CAPSULE | ORAL | 0 refills | Status: DC

## 2017-01-31 MED ORDER — LEVOTHYROXINE SODIUM 88 MCG PO TABS: 88.0000 ug | ORAL_TABLET | Freq: Every day | ORAL | 0 refills | Status: DC

## 2017-01-31 MED FILL — VIT D2 1.25 MG (50,000 UNIT: 1.25 MG | 56 days supply | Qty: 8 | Fill #0

## 2017-01-31 MED FILL — LEVOTHYROXINE 88 MCG TABLET: 88 | 60 days supply | Qty: 60 | Fill #0

## 2017-02-03 ENCOUNTER — Encounter: Payer: Self-pay | Admitting: Osteopathic Medicine

## 2017-02-03 ENCOUNTER — Ambulatory Visit (INDEPENDENT_AMBULATORY_CARE_PROVIDER_SITE_OTHER): Payer: 59 | Admitting: Osteopathic Medicine

## 2017-02-03 VITALS — BP 123/74 | HR 78 | Ht 63.0 in | Wt 225.0 lb

## 2017-02-03 DIAGNOSIS — Z8632 Personal history of gestational diabetes: Secondary | ICD-10-CM

## 2017-02-03 DIAGNOSIS — E039 Hypothyroidism, unspecified: Secondary | ICD-10-CM | POA: Diagnosis not present

## 2017-02-03 DIAGNOSIS — Z87898 Personal history of other specified conditions: Secondary | ICD-10-CM | POA: Diagnosis not present

## 2017-02-03 DIAGNOSIS — D508 Other iron deficiency anemias: Secondary | ICD-10-CM

## 2017-02-03 DIAGNOSIS — E559 Vitamin D deficiency, unspecified: Secondary | ICD-10-CM

## 2017-02-03 MED ORDER — FERROUS FUMARATE 325 (106 FE) MG PO TABS: 1.0000 | ORAL_TABLET | Freq: Two times a day (BID) | ORAL | 1 refills | Status: DC

## 2017-02-03 NOTE — Progress Notes (Signed)
HPI: Cathy Daniels is a 30 y.o. female  who presents to [place] today, 02/03/17,  for chief complaint of:  Chief Complaint  Patient presents with  . Follow-up    LABS    Anemia . Context: Hx iron deficiency - no records available for review . Quality: iron deficiency assoc w/ fatigue . Severity: Hgb <10 . Duration: years . Modifying factors: ferrous sulfate caused rash, other iron preparation was ok, no IV iron ever given and pt would like to avoid this . Assoc signs/symptoms: fatigue, vot D deficiency, unknown thalassemia hx   Vit D deficiency - quite severe  Thyroid - TSH mild elevated, adjusted medications     Past medical history, surgical history, social history and family history reviewed.  Patient Active Problem List   Diagnosis Date Noted  . Hypothyroidism 01/28/2017  . History of prediabetes 01/28/2017  . History of gestational diabetes 01/28/2017  . Irregular periods 01/28/2017  . Eczema of both hands 01/28/2017  . Situational anxiety 01/28/2017  . History of asthma 01/28/2017  . Menstrual cramp 01/28/2017  . Pregnancy 12/17/2013  . Vaginal delivery 12/17/2013  . ALLERGIC RHINITIS CAUSE UNSPECIFIED 03/30/2010  . RASH AND OTHER NONSPECIFIC SKIN ERUPTION 03/30/2010  . Asthma, moderate persistent 04/19/2008    Current medication list and allergy/intolerance information reviewed.   Current Outpatient Prescriptions on File Prior to Visit  Medication Sig Dispense Refill  . budesonide (PULMICORT) 180 MCG/ACT inhaler Inhale 1 puff into the lungs 2 (two) times daily as needed (For asthma).    . ibuprofen (ADVIL,MOTRIN) 600 MG tablet Take 1 tablet (600 mg total) by mouth every 6 (six) hours. 30 tablet 0  . levothyroxine (SYNTHROID, LEVOTHROID) 88 MCG tablet Take 1 tablet (88 mcg total) by mouth daily before breakfast. When pills running low, will be due to recheck blood work 60 tablet 0  . Vitamin D, Ergocalciferol, (DRISDOL) 50000  units CAPS capsule Take 1 capsule (50,000 Units total) by mouth every 7 (seven) days. Take for 8 total doses(weeks) 8 capsule 0   No current facility-administered medications on file prior to visit.    Allergies  Allergen Reactions  . Ferrous Sulfate Hives    Repliva OK  . Sulfonamide Derivatives Hives  . Latex Hives, Itching and Rash    Itching and rash when wearing latex gloves      Review of Systems:  Constitutional: No recent illness, +fatigue  HEENT: No  headache  Cardiac: No  chest pain, No  pressure, No palpitations  Respiratory:  No  shortness of breath  Gastrointestinal: No  abdominal pain, no change on bowel habits, no bloody stool  Musculoskeletal: No new myalgia/arthralgia  Exam:  BP 123/74   Pulse 78   Ht '5\' 3"'$  (1.6 m)   Wt 225 lb (102.1 kg)   BMI 39.86 kg/m   Constitutional: VS see above. General Appearance: alert, well-developed, well-nourished, NAD  Respiratory: Normal respiratory effort.   Musculoskeletal: Gait normal. Symmetric and independent movement of all extremities  Neurological: Normal balance/coordination. No tremor.  Skin: warm, dry, intact.   Psychiatric: Normal judgment/insight. Normal mood and affect. Oriented x3.   Labs reviewed in detail with the patient - no smear available for review - ?problem with add-on?   Recent Results (from the past 2160 hour(s))  CBC with Differential/Platelet     Status: Abnormal   Collection Time: 01/28/17 10:04 AM  Result Value Ref Range   WBC 7.5 3.8 - 10.8 K/uL   RBC  4.64 3.80 - 5.10 MIL/uL   Hemoglobin 9.7 (L) 11.7 - 15.5 g/dL   HCT 32.8 (L) 35.0 - 45.0 %   MCV 70.7 (L) 80.0 - 100.0 fL   MCH 20.9 (L) 27.0 - 33.0 pg   MCHC 29.6 (L) 32.0 - 36.0 g/dL   RDW 16.7 (H) 11.0 - 15.0 %   Platelets 464 (H) 140 - 400 K/uL   MPV 9.8 7.5 - 12.5 fL   Neutro Abs 4,200 1,500 - 7,800 cells/uL   Lymphs Abs 2,475 850 - 3,900 cells/uL   Monocytes Absolute 300 200 - 950 cells/uL   Eosinophils Absolute 450 15  - 500 cells/uL   Basophils Absolute 75 0 - 200 cells/uL   Neutrophils Relative % 56 %   Lymphocytes Relative 33 %   Monocytes Relative 4 %   Eosinophils Relative 6 %   Basophils Relative 1 %   Smear Review Criteria for review not met   COMPLETE METABOLIC PANEL WITH GFR     Status: None   Collection Time: 01/28/17 10:04 AM  Result Value Ref Range   Sodium 138 135 - 146 mmol/L   Potassium 4.3 3.5 - 5.3 mmol/L   Chloride 108 98 - 110 mmol/L   CO2 22 20 - 31 mmol/L   Glucose, Bld 99 65 - 99 mg/dL   BUN 12 7 - 25 mg/dL   Creat 0.68 0.50 - 1.10 mg/dL   Total Bilirubin 0.5 0.2 - 1.2 mg/dL   Alkaline Phosphatase 59 33 - 115 U/L   AST 13 10 - 30 U/L   ALT 9 6 - 29 U/L   Total Protein 6.8 6.1 - 8.1 g/dL   Albumin 4.2 3.6 - 5.1 g/dL   Calcium 8.9 8.6 - 10.2 mg/dL   GFR, Est African American >89 >=60 mL/min   GFR, Est Non African American >89 >=60 mL/min  Lipid panel     Status: Abnormal   Collection Time: 01/28/17 10:04 AM  Result Value Ref Range   Cholesterol 121 <200 mg/dL   Triglycerides 108 <150 mg/dL   HDL 40 (L) >50 mg/dL   Total CHOL/HDL Ratio 3.0 <5.0 Ratio   VLDL 22 <30 mg/dL   LDL Cholesterol 59 <100 mg/dL  TSH     Status: Abnormal   Collection Time: 01/28/17 10:04 AM  Result Value Ref Range   TSH 4.87 (H) mIU/L    Comment:    Hemoglobin A1c     Status: Abnormal   Collection Time: 01/28/17 10:04 AM  Result Value Ref Range   Hgb A1c MFr Bld 5.7 (H) <5.7 %    Comment:      Mean Plasma Glucose 117 mg/dL  VITAMIN D 25 Hydroxy (Vit-D Deficiency, Fractures)     Status: Abnormal   Collection Time: 01/28/17 10:04 AM  Result Value Ref Range   Vit D, 25-Hydroxy 9 (L) 30 - 100 ng/mL    Comment: Vitamin D Status           25-OH Vitamin D        Deficiency                <20 ng/mL        Insufficiency         20 - 29 ng/mL        Optimal             > or = 30 ng/mL   For 25-OH Vitamin D testing on patients on   Ferritin  Status: Abnormal   Collection Time: 01/29/17   8:17 AM  Result Value Ref Range   Ferritin 7 (L) 10 - 154 ng/mL  Iron and TIBC     Status: Abnormal   Collection Time: 01/29/17  8:17 AM  Result Value Ref Range   Iron 25 (L) 40 - 190 ug/dL   UIBC 400 125 - 400 ug/dL   TIBC 425 250 - 450 ug/dL   %SAT 6 (L) 11 - 50 %  Reticulocytes     Status: None   Collection Time: 01/29/17  8:17 AM  Result Value Ref Range   Retic Ct Pct 1.3 %   RBC. 4.66 3.80 - 5.10 MIL/uL   ABS Retic 60,580 20,000 - 80,000 cells/uL     ASSESSMENT/PLAN:   Other iron deficiency anemia - Plan: ferrous fumarate (HEMOCYTE - 106 MG FE) 325 (106 Fe) MG TABS tablet  Hypothyroidism, unspecified type  History of gestational diabetes  History of prediabetes  Vitamin D deficiency    Patient Instructions  Call me in 6 weeks with lab you'd like me to place the orders to  CBC TSH Vit D     Follow-up plan: Return for  - depends on lab results .  Visit summary with medication list and pertinent instructions was printed for patient to review, alert [place] if any changes needed. All questions at time of visit were answered - patient instructed to contact office with any additional concerns. ER/RTC precautions were reviewed with the patient and understanding verbalized.

## 2017-02-03 NOTE — Patient Instructions (Signed)
Call me in 6 weeks with lab you'd like me to place the orders to  CBC TSH Vit D

## 2017-03-10 ENCOUNTER — Telehealth: Payer: Self-pay

## 2017-03-10 NOTE — Telephone Encounter (Signed)
Pt started that she started care with you this year, and she had talked with you about her menstrual cramps.  She said that you refill her Naproxen 500 mg, 60 day supply at [facility].  Please advise.

## 2017-03-11 MED FILL — NAPROXEN 500 MG TABLET: 500 | 30 days supply | Qty: 60 | Fill #0

## 2017-03-12 DIAGNOSIS — S0502XA Injury of conjunctiva and corneal abrasion without foreign body, left eye, initial encounter: Secondary | ICD-10-CM | POA: Diagnosis not present

## 2017-03-12 MED ORDER — NAPROXEN 500 MG PO TABS: 500.0000 mg | ORAL_TABLET | Freq: Two times a day (BID) | ORAL | 2 refills | Status: DC

## 2017-03-12 NOTE — Telephone Encounter (Signed)
Prescription sent

## 2017-04-03 ENCOUNTER — Telehealth: Payer: Self-pay | Admitting: Osteopathic Medicine

## 2017-04-03 ENCOUNTER — Telehealth: Payer: Self-pay | Admitting: *Deleted

## 2017-04-03 DIAGNOSIS — E559 Vitamin D deficiency, unspecified: Secondary | ICD-10-CM | POA: Diagnosis not present

## 2017-04-03 DIAGNOSIS — E039 Hypothyroidism, unspecified: Secondary | ICD-10-CM

## 2017-04-03 DIAGNOSIS — E611 Iron deficiency: Secondary | ICD-10-CM

## 2017-04-03 NOTE — Telephone Encounter (Signed)
Please ask which medication and see if due.  Please ask what in particular her questions are, and I will get back to her or we can have her schedule follow-up if there is a lot to go over.

## 2017-04-03 NOTE — Telephone Encounter (Signed)
Order for labs placed. Patient notified that labs have been placed.she is going to go downstairs for labs

## 2017-04-03 NOTE — Telephone Encounter (Signed)
I talked to her earlier and she was calling for [place] to go ahead and put in the lab order. Lab order placed. She also wanted to know if she could just get a call to go over labs instead of coming into the office  for appointment due to cost. I think she is referring to the mention of coming back in the office depending on the lab results per the progress note/AVS.

## 2017-04-03 NOTE — Telephone Encounter (Signed)
Cathy Daniels left a message for Cathy Daniels on her VM, but she would like to hear from you about a medication. She said she about to run out of the meds, but she has some things to discuss about her last visit

## 2017-04-03 NOTE — Telephone Encounter (Signed)
That's fine - as long as no serious abnormality we can inform her of results over the phone.

## 2017-04-05 LAB — CBC
HEMATOCRIT: 32.2 % — AB (ref 35.0–45.0)
Hemoglobin: 9.8 g/dL — ABNORMAL LOW (ref 11.7–15.5)
MCH: 21.6 pg — ABNORMAL LOW (ref 27.0–33.0)
MCHC: 30.4 g/dL — AB (ref 32.0–36.0)
MCV: 70.9 fL — AB (ref 80.0–100.0)
MPV: 9.7 fL (ref 7.5–12.5)
Platelets: 445 10*3/uL — ABNORMAL HIGH (ref 140–400)
RBC: 4.54 MIL/uL (ref 3.80–5.10)
RDW: 16.5 % — AB (ref 11.0–15.0)
WBC: 9.4 10*3/uL (ref 3.8–10.8)

## 2017-04-05 LAB — TSH: TSH: 2.46 mIU/L

## 2017-04-05 LAB — VITAMIN D 25 HYDROXY (VIT D DEFICIENCY, FRACTURES): VIT D 25 HYDROXY: 18 ng/mL — AB (ref 30–100)

## 2017-04-07 MED ORDER — VITAMIN D (ERGOCALCIFEROL) 1.25 MG (50000 UNIT) PO CAPS
50000.0000 [IU] | ORAL_CAPSULE | ORAL | 1 refills | Status: DC
Start: 2017-04-07 — End: 2018-12-24

## 2017-04-07 MED FILL — VIT D2 1.25 MG (50,000 UNIT: 1.25 MG | 56 days supply | Qty: 8 | Fill #0

## 2017-04-07 NOTE — Addendum Note (Signed)
Addended by: Deirdre Pippins on: 04/07/2017 11:45 AM   Modules accepted: Orders

## 2017-04-10 ENCOUNTER — Telehealth: Payer: Self-pay | Admitting: Osteopathic Medicine

## 2017-04-10 DIAGNOSIS — D508 Other iron deficiency anemias: Secondary | ICD-10-CM

## 2017-04-10 MED ORDER — LEVOTHYROXINE SODIUM 88 MCG PO TABS: 88.0000 ug | ORAL_TABLET | Freq: Every day | ORAL | 1 refills | Status: DC

## 2017-04-10 MED ORDER — FERROUS FUMARATE 325 (106 FE) MG PO TABS: 1.0000 | ORAL_TABLET | Freq: Two times a day (BID) | ORAL | 1 refills | Status: DC

## 2017-04-10 MED FILL — LEVOTHYROXINE 88 MCG TABLET: 88 | 90 days supply | Qty: 90 | Fill #0

## 2017-04-10 NOTE — Telephone Encounter (Signed)
Sounds good. Thanks.  Any other questions - pt should schedule a visit

## 2017-04-10 NOTE — Telephone Encounter (Signed)
Pt called clinic to get update on labs and her values. Advised of current labs. Informed she needs to continue her vitamin D. Verbalized understanding. Does state she tries to keep up with the iron but taking it TID was hard with her schedule. Did request a refill on her iron (Rx version is only BID) and levothyroxine. Refills sent.

## 2017-04-14 ENCOUNTER — Other Ambulatory Visit: Payer: Self-pay

## 2017-04-14 DIAGNOSIS — D508 Other iron deficiency anemias: Secondary | ICD-10-CM

## 2017-04-14 MED ORDER — FERROUS GLUCONATE 324 (38 FE) MG PO TABS: 324.0000 mg | ORAL_TABLET | Freq: Three times a day (TID) | ORAL | 3 refills | Status: DC

## 2017-04-14 NOTE — Progress Notes (Signed)
Patient has reported history of intolerance to standard oral iron supplementation (ferrous sulfate) causing hives. If what I sent instead (Ferrous fumarate) is not available, will trial alternative (ferrous gluconate) but this may also be difficult to find. Would still recommend that the patient consider IV iron supplementation if unable to find an oral formulation. Let me know if pharmacy unable to fill the oral alternative, there might be one more we could try before going to IV. Pharmacy may also be able to recommend an alternative in stock.

## 2017-04-14 NOTE — Progress Notes (Signed)
Pt called stating that her pharmacy does not carry the pended medication and shewas advised that a lot of pharmacies do not carry it. She would like an alternative medication sent in if possible. Please advise.

## 2017-04-16 NOTE — Progress Notes (Signed)
Pt would like ferrous gluconate sent to [facility].

## 2017-04-16 NOTE — Progress Notes (Signed)
Was sent Monday

## 2017-07-09 MED FILL — LEVOTHYROXINE 88 MCG TABLET: 88 | 90 days supply | Qty: 90 | Fill #1

## 2017-07-09 MED FILL — NAPROXEN 500 MG TABLET: 500 | 30 days supply | Qty: 60 | Fill #0

## 2017-10-20 ENCOUNTER — Other Ambulatory Visit: Payer: Self-pay | Admitting: Osteopathic Medicine

## 2017-10-20 MED FILL — LEVOTHYROXINE 88 MCG TABLET: 88 | 90 days supply | Qty: 90 | Fill #0

## 2017-10-24 MED FILL — IBUPROFEN 600 MG TABLET: 600 | 5 days supply | Qty: 20 | Fill #0

## 2017-10-24 MED FILL — HYDROCODON-APAP 5-325: 5-325 | 2 days supply | Qty: 20 | Fill #0

## 2017-10-24 MED FILL — PENICILLIN VK 500 MG TABLET: 500 | 7 days supply | Qty: 28 | Fill #0

## 2018-01-28 ENCOUNTER — Encounter: Payer: Self-pay | Admitting: Osteopathic Medicine

## 2018-01-28 MED FILL — LEVOTHYROXINE 88 MCG TABLET: 88 | 90 days supply | Qty: 90 | Fill #1

## 2018-04-22 ENCOUNTER — Other Ambulatory Visit (HOSPITAL_COMMUNITY)
Admission: RE | Admit: 2018-04-22 | Discharge: 2018-04-22 | Disposition: A | Discharge status: Home / Self Care | Payer: No Typology Code available for payment source | Source: Ambulatory Visit | Attending: Family Medicine | Admitting: Family Medicine

## 2018-04-22 ENCOUNTER — Other Ambulatory Visit: Payer: Self-pay | Admitting: Family Medicine

## 2018-04-22 DIAGNOSIS — Z124 Encounter for screening for malignant neoplasm of cervix: Secondary | ICD-10-CM | POA: Diagnosis present

## 2018-04-24 LAB — CYTOLOGY - PAP
Diagnosis: NEGATIVE
HPV: NOT DETECTED

## 2018-04-30 MED FILL — LEVOTHYROXINE 88 MCG TABLET: 88 | 90 days supply | Qty: 90 | Fill #0

## 2018-04-30 MED FILL — METOCLOPRAMIDE 10 MG TABLET: 10 | 20 days supply | Qty: 60 | Fill #0

## 2018-04-30 MED FILL — FERROUS GLUCONATE 324 MG TA: 324 (38 FE) | 30 days supply | Qty: 30 | Fill #0

## 2018-05-13 MED FILL — DICLEGIS DR 10-10 MG TABLET: 10-10 | 30 days supply | Qty: 120 | Fill #0

## 2018-05-27 DIAGNOSIS — E119 Type 2 diabetes mellitus without complications: Secondary | ICD-10-CM | POA: Insufficient documentation

## 2018-05-28 ENCOUNTER — Other Ambulatory Visit: Payer: Self-pay | Admitting: *Deleted

## 2018-05-28 ENCOUNTER — Ambulatory Visit: Payer: Self-pay | Admitting: *Deleted

## 2018-05-28 MED FILL — LEVOTHYROXINE 112 MCG TAB: 112 | 30 days supply | Qty: 30 | Fill #0

## 2018-05-28 MED FILL — METFORMIN HCL ER 500 MG TAB: 500 | 30 days supply | Qty: 60 | Fill #0

## 2018-05-29 NOTE — Patient Outreach (Addendum)
[place] [facility]) Care Management   05/28/2018  Cathy Daniels [birth year] [id number]  Cathy Daniels is an 32 y.o. female originally from [place] who was advised by her endocrinologist Dr. Chalmers Cater when she saw her on 05/27/18 to enroll in the [facility] digital assistant platform for Type 2 diabetes self management to assist with close observation and management of diabetes during pregnancy..   Subjective: Kynedi states she just found out this week she is pregnant with her second child and states her due date is 12/27/2018.  She says she was diagnosed with gestational diabetes when she was 4-5 months pregnant with her 39 year old daughter in 2014 and required Metformin initially then insulin during the last trimester. She says once she delivered she was able to keep her A1C in the prediabetes range with weight loss, healthy eating and exercise. She says she is much heavier today than when she became pregnant in 20014. She says her Hgb A1C was checked on 05/27/18 when she saw Dr. Chalmers Cater and it was 5.8%, previously 6.1% on 04/22/18. She says Dr. Chalmers Cater has prescribed Metformin and she will pick up the prescription today. She says she had hyperemesis  Gravidarum with her first pregnancy and has been very nauseated with her current pregnancy so she is hopeful she can tolerate the Metformin without worsening nausea.  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  She also reports a history of hypothyroidism with a recent increase in her thyroid replacement medication and iron deficiency anemia and Vit D deficiency.  She says Dr. Chalmers Cater suggested she use the [facility] flash glucose monitoring system to help with blood sugar control during her pregnancy.    Encounter Medications:   Outpatient Encounter Medications as of 05/28/2018  Medication Sig Note  . DICLEGIS 10-10 MG TBEC    . ferrous  gluconate (FERGON) 324 MG tablet Take 1 tablet (324 mg total) by mouth 3 (three) times daily with meals.   [name] levothyroxine (SYNTHROID, LEVOTHROID) 88 MCG tablet TAKE 1 TABLET BY MOUTH ONCE DAILY BEFORE BREAKFAST 05/29/2018: Dose was increased to 112 mcg   . Prenatal MV & Min w/FA-DHA (PRENATAL ADULT GUMMY/DHA/FA) 0.4-25 MG CHEW Chew 1 tablet by mouth.   . budesonide (PULMICORT) 180 MCG/ACT inhaler Inhale 1 puff into the lungs 2 (two) times daily as needed (For asthma).   . metFORMIN (GLUCOPHAGE-XR) 500 MG 24 hr tablet  05/29/2018: To start 05/29/18  . naproxen (NAPROSYN) 500 MG tablet Take 1 tablet (500 mg total) by mouth 2 (two) times daily with a meal. As needed for pain (Patient not taking: Reported on 05/29/2018)   . Vitamin D, Ergocalciferol, (DRISDOL) 50000 units CAPS capsule Take 1 capsule (50,000 Units total) by mouth every 7 (seven) days. Take for 8 total doses(weeks) (Patient not taking: Reported on 05/29/2018)    No facility-administered encounter medications on file as of 05/28/2018.     Functional Status:   In your present state of health, do you have any difficulty performing the following activities: 05/29/2018  Hearing? N  Vision? N  Difficulty concentrating or making decisions? N  Walking or climbing stairs? N  Dressing or bathing? N  Doing errands, shopping? N  Preparing Food and eating ? N  Using the Toilet? N  In the past six months, have you accidently leaked urine? N  Do you have problems with  loss of bowel control? N  Managing your Medications? N  Managing your Finances? N  Housekeeping or managing your Housekeeping? N  Some recent data might be hidden    Fall/Depression Screening:    No flowsheet data found. PHQ 2/9 Scores 01/28/2017  PHQ - 2 Score 0    Assessment:  [place] employee with Type II DM and in first trimester of pregnancy enrolling in the [place] Gestational Diabetes Program and will enroll in the [facility] platform for  self management assistance with Type II diabetes.   Plan:  [facility] CM Care Plan Problem One     Most Recent Value  Care Plan Problem One  Knowledge deficit related to management of Type II DM during pregnancy  Role Documenting the Problem One  Care Management Coordinator  Care Plan for Problem One  Active  THN Long Term Goal   In the next 90 days patient will demonstrate good understanding of treatment of diabetes during pregnancy as evidenced by: self-monitoring of blood sugars 4 times daily or as prescribed with 90% of blood sugars meeting target, adherence to Plate Method or carb controlled meal plan, completion of assigned Emmi modules, adherence to provider appointments, adherence in contacting this RNCM at least monthly per program guidelines  THN Long Term Goal Start Date  05/28/18  Interventions for Problem One Long Term Goal  Reviewed [place] Management Gestational and or Preexisting Diabetes Program guidelines and benefits, provided information packet with explanation of contents, provided Accu-chek glucometer and starter kit with 150 test strips and ensured patient know how to use glucometer correctly, demonstrated the [employer] flash glucose monitoring system and discussed out of pocket costs for the system,   reviewed the American Diabetes Association recommendations related to frequency  of glucose testing and targets during pregnancy, defined hypoglycemia, symptoms and  reviewed rule of 15s for treating hypoglycemia,  reviewed strategies to treat elevated glucose, reviewed  plate method and/or basic carbohydrate counting, discussed effect of stress on blood sugar and reviewed coping strategies, reinforced the importance of keeping provider appointments, explained Careers information officer platform for diabetes self-management and arranged for patient to onboard on 06/03/18, encouraged patient to contact this RNCM for questions or concerns related to diabetes  self-management during pregnancy.     This RNCM will fax today's note to patient's primary care provider and endocrinologist. Will assist patient with diabetes self management during pregnancy via the [facility] digital assistant platform after she is onboarded on 06/03/18.  Barrington Ellison RN,CCM,CDE [place] Management Coordinator Office Phone [phone] Office Fax [phone]

## 2018-06-08 ENCOUNTER — Other Ambulatory Visit: Payer: Self-pay | Admitting: *Deleted

## 2018-06-08 ENCOUNTER — Encounter: Payer: Self-pay | Admitting: *Deleted

## 2018-06-08 LAB — OB RESULTS CONSOLE RUBELLA ANTIBODY, IGM: Rubella: IMMUNE

## 2018-06-08 LAB — OB RESULTS CONSOLE RPR: RPR: NONREACTIVE

## 2018-06-08 LAB — OB RESULTS CONSOLE HIV ANTIBODY (ROUTINE TESTING): HIV: NONREACTIVE

## 2018-06-08 LAB — OB RESULTS CONSOLE GC/CHLAMYDIA
CHLAMYDIA, DNA PROBE: NEGATIVE
GC PROBE AMP, GENITAL: NEGATIVE

## 2018-06-08 LAB — OB RESULTS CONSOLE ANTIBODY SCREEN: Antibody Screen: NEGATIVE

## 2018-06-08 LAB — OB RESULTS CONSOLE HEPATITIS B SURFACE ANTIGEN: Hepatitis B Surface Ag: NEGATIVE

## 2018-06-08 LAB — OB RESULTS CONSOLE ABO/RH: RH Type: POSITIVE

## 2018-06-08 NOTE — Patient Outreach (Signed)
[facility](THN) Care Management  06/08/2018  [place] CharonBableen Daniels 28-Dec-1985 [id number]   Cathy FredricksonBableen returned call to this RNCM to advise the batteries in her Wellsmith diabetes digital assistant devices need replacement. She says the scale battery has never worked and the Scientific laboratory technicianglucometer battery is now dead.  She was just onboarded to [employer] on 6/10. Advised her this RNCM will contact Wellsmith support for direction and message her in [place] with a resolution when [employer] support responds.  Cathy RichardJanet S. Cathy Grunden RN,CCM,CDE [facility] Care Management Coordinator Office Phone [phone] Office Fax [phone]

## 2018-06-08 NOTE — Patient Outreach (Signed)
[facility](THN) Care Management  06/08/2018  [place] CharonBableen Siebert 11/26/1986 [id number]   Received message in the [facility] digital assistant diabetes self management platform from [place] at 2:49 pm  requesting this RNCM call her. Left message on Marilouise's mobile number when the message was seen in Wellsmith at 3:36 pm. Await return call from [place].  Bary RichardJanet S. Deryk Bozman RN,CCM,CDE [facility] Care Management Coordinator Office Phone [phone] Office Fax [phone]

## 2018-06-11 ENCOUNTER — Other Ambulatory Visit: Payer: Self-pay | Admitting: General Surgery

## 2018-06-29 MED FILL — LEVOTHYROXINE 112 MCG TAB: 112 | 30 days supply | Qty: 30 | Fill #1

## 2018-07-01 ENCOUNTER — Other Ambulatory Visit: Payer: Self-pay | Admitting: *Deleted

## 2018-07-01 NOTE — Patient Outreach (Signed)
[facility](THN) Care Management  07/01/2018  [place] CharonBableen Daniels Sep 01, 1986 [id number]   Sent secure e-mail to [facility] at her [facility] e-mail address requesting clinical status update. She is in the [facility] Gestational Diabetes Program. She is pregnant with her second child and her due date is 12/27/18.  Await reply from [place].  Bary RichardJanet S. Hauser RN,CCM,CDE [facility] Care Management Coordinator Office Phone [phone] Office Fax [phone]

## 2018-07-07 ENCOUNTER — Other Ambulatory Visit: Payer: Self-pay | Admitting: *Deleted

## 2018-07-07 MED FILL — UNIFINE PENTIPS 32GX5/32: 32G X 4 MM | 40 days supply | Qty: 200 | Fill #0

## 2018-07-07 MED FILL — UNIFINE PENTIPS 32GX5/32": 32G X 4 MM | 40 days supply | Qty: 200 | Fill #0

## 2018-07-07 MED FILL — HUMULIN N 100 UNITS/ML KWIK: 100 | 10 days supply | Qty: 3 | Fill #0

## 2018-07-07 NOTE — Patient Outreach (Signed)
[facility](THN) Care Management  07/07/2018  Cathy CharonBableen Daniels 06-Feb-1986 [id number]   Received call from [facility] after she saw Dr. Talmage NapBalan today. She states she was started on Humalog N 5 units twice daily and she may require mealtime coverage at a later date. Cathy FredricksonBableen  is also participating in the [employer] digital assistant platform for self management assistance with her diabetes so with her permission will notify Wellsmith of the addition of insulin to her medication regimen so it can be added to her [facility] care plan.  Bary RichardJanet S. Abdalrahman Clementson RN,CCM,CDE [facility] Care Management Coordinator Office Phone [phone] Office Fax [phone]

## 2018-07-07 NOTE — Patient Outreach (Signed)
[facility](THN) Care Management  07/07/2018  [place] CharonBableen Prevo 14-Apr-1986 [id number]  Returned call to [place] and she provided an update on her gestational diabetes self management. She states she is going to see Dr Talmage NapBalan today and thinks she may be put on insulin as she cannot tolerate the Metformin due to her hyperemesis gravidarum. Reviewed with Deonna the insulins that are covered at no cost under the gestational diabetes program and the [facility] digital assistant program. She reviewed her CBG readings and shared her frustration that the Diclegis 10-10 is no longer covered on the [employer] and she will now be held accountable to monthly copay of $140.  Advised Mindel that [employer] will be sending this RNCM her weekly CBG reports to assist with self management and offered to fax a report to Dr. Talmage NapBalan prior to her next appointment. Also advised Shelle this RNCM will contact the [facility] Operations Coordinator to see if there are other options to obtain the Diclegis at a lower cost.  Norma FredricksonBableen states she will contact this RNCM with an update after she sees Dr Talmage NapBalan today.   Bary RichardJanet S. Pamella Samons RN,CCM,CDE [facility] Care Management Coordinator Office Phone [phone] Office Fax [phone]

## 2018-07-13 MED FILL — ACCU-CHEK GUIDE TEST STRIP: 50 days supply | Qty: 100 | Fill #0

## 2018-07-24 MED FILL — HUMULIN N 100 UNITS/ML KWIK: 100 | 22 days supply | Qty: 6 | Fill #0

## 2018-08-03 MED FILL — HUMULIN N 100 UNITS/ML KWIK: 100 | 90 days supply | Qty: 36 | Fill #0

## 2018-08-05 ENCOUNTER — Other Ambulatory Visit: Payer: Self-pay | Admitting: *Deleted

## 2018-08-05 NOTE — Patient Outreach (Signed)
[facility](THN) Care Management  08/05/2018  [place] CharonBableen Caradonna 1986/05/06 [id number]   Secure e-mail to [facility] asking her permission to fax her Wellsmith CBG report to Dr Talmage NapBalan, her endocrinologist, as she still has many values not meeting target, both fasting and non fasting. Sung responded immediately and gave permission and also requested the CBG report be faxed to her OB provider, Dr. Huel CoteKathy Richardson as she is seeing her this morning. Faxed the report and emailed Susy that successful fax transmission verification was received from Dr Berenda Moraleichardson's and Dr. Willeen CassBalan's office. She also provided update that her nausea is much better with the [facility] she received from her OB provider but she will now go back on Diclegis since she is out of samples. She also reported that Dr. Talmage NapBalan increased her NPH insulin to 25 units in the morning and 15 units in the evening on 07/31/18. Forwarded this information to Franklin ResourcesJulie Montpellier, Chesapeake EnergyBableen's clinical support RN, CDE in the [facility] platform so that the insulin doses can be changed to reflect the current dosages on  [employer] GroupBableen's Wellsmith care plan.  Bary RichardJanet S. Hauser RN,CCM,CDE [facility] Care Management Coordinator Office Phone [phone] Office Fax [phone]

## 2018-08-10 MED FILL — LEVOTHYROXINE 112 MCG TAB: 112 | 30 days supply | Qty: 30 | Fill #2

## 2018-08-11 ENCOUNTER — Other Ambulatory Visit: Payer: Self-pay | Admitting: *Deleted

## 2018-08-11 NOTE — Patient Outreach (Signed)
[facility](THN) Care Management  08/11/2018  [name] May 17, 1986 [id number]   Cathy Daniels is in the [facility] Care Management Gestational Diabetes Program. Secure e-mail sent to [facility]'s [facility] e-mail address advising that her Wellsmith CBG report was faxed to Dr. Willeen CassBalan's office so that she has it to review when Cathy Daniels sees her on 8/21 at 9:30 am.  Cathy RichardJanet S. Hauser RN,CCM,CDE [facility] Care Management Coordinator Office Phone [phone] Office Fax [phone]

## 2018-08-11 NOTE — Patient Outreach (Addendum)
[facility](THN) Care Management  08/11/2018  [place] CharonBableen Hooser [birth year] [id number]   Norma FredricksonBableen is a member of the [facility] Care Management Gestational Diabetes Program. Received return e-mail from [place] thanking this [facility] HospitalRNCM for faxing her Wellsmith CBG report to Dr. Talmage NapBalan, her endocrinologist. She also advised this RNCM that her morning insulin dose was increased by Dr. Talmage NapBalan on 8/19  from 25 units to 35 units and that her bedtime dose remains at 15 units. Changes made on her medication list in Epic and forwarded changes to her insulin dose to [facility] Center[facility]ie Montpellier RN, CDE- Mareena's clincal support in the [employer] digital assistant platform.  Bary RichardJanet S. Hauser RN,CCM,CDE [facility] Care Management Coordinator Office Phone [phone] Office Fax [phone]

## 2018-08-12 MED FILL — HUMALOG 200 UNITS/ML KWIKPE: 200 | 22 days supply | Qty: 3 | Fill #0

## 2018-08-14 MED FILL — ACCU-CHEK GUIDE TEST STRIP: 33 days supply | Qty: 200 | Fill #0

## 2018-08-27 ENCOUNTER — Other Ambulatory Visit: Payer: Self-pay | Admitting: *Deleted

## 2018-08-27 NOTE — Patient Outreach (Signed)
[facility]) Care Management  08/27/2018  Rahf Rosdahl 10/12/1986 [id number]   Received e-mail from [place] requesting that the last 2 weeks of her Wellsmith blood sugar readings be faxed to her endocrinologist, Dr Talmage Nap.  Susette Racer, Shakti's clinical RN support for the [employer] had scanned the report in Epic. Per Julie's  request, this RNCM routed the report  to Dr Talmage Nap. Glucose control has significantly improved per  Review of CBG report.  Sent e-mail to [facility] requesting update and if any changes were made by Dr. Talmage Nap to her gestational diabetes treatment plan.  [name] RN,CCM,CDE [facility] Care Management Coordinator Office Phone [phone] Office Fax [phone]

## 2018-08-31 ENCOUNTER — Other Ambulatory Visit: Payer: Self-pay | Admitting: *Deleted

## 2018-08-31 NOTE — Patient Outreach (Signed)
[facility]) Care Management   08/28/2018  Cathy Daniels June 25, 1986 [id number]  Cathy Daniels is an 32 y.o. female originally from [place] who was advised by her endocrinologist Dr. Talmage Nap when she saw her on 05/27/18 to enroll in the [facility] digital assistant platform for Type 2 diabetes self management to assist with close observation and management of diabetes during pregnancy.  Subjective: [name] sent a follow up e-mail to this RNCM with the following information.  States she remains nauseated and tired but now rarely vomiting as long as she takes the antiemetic prescribed for her. She says Dr. Talmage Nap reviewed her latest  CBG report from [facility] and no changes were made to her current diabetes treatment regimen. She also advised this RNCM that she is not using the  [facility] flash glucose monitoring system and continues to participate in [place], the Writer platfrom for Type 2 diabetes self management. She says she is checking her blood sugars 5-6 times daily  As he was started on mealtime insulin on 8/22 with a sliding scale, in addition to her previous NPH insulin twice daily.    Outpatient Encounter Medications as of 08/31/2018  Medication Sig Note  . insulin lispro (HUMALOG) 100 UNIT/ML injection Inject 3 Units into the skin once.   . insulin lispro (HUMALOG) 100 UNIT/ML injection Inject 4 Units into the skin once.   . insulin NPH Human (HUMULIN N,NOVOLIN N) 100 UNIT/ML injection Inject 35 Units into the skin daily before breakfast.    . insulin NPH Human (HUMULIN N,NOVOLIN N) 100 UNIT/ML injection Inject 15 Units into the skin daily before supper.   . budesonide (PULMICORT) 180 MCG/ACT inhaler Inhale 1 puff into the lungs 2 (two) times daily as needed (For asthma).   [name] DICLEGIS 10-10 MG TBEC    . ferrous gluconate (FERGON) 324 MG tablet Take 1 tablet (324 mg total) by mouth 3 (three) times daily with meals.   [name] levothyroxine (SYNTHROID, LEVOTHROID) 88 MCG  tablet TAKE 1 TABLET BY MOUTH ONCE DAILY BEFORE BREAKFAST 05/29/2018: Dose was increased to 112 mcg   . metFORMIN (GLUCOPHAGE-XR) 500 MG 24 hr tablet  05/29/2018: To start 05/29/18  . naproxen (NAPROSYN) 500 MG tablet Take 1 tablet (500 mg total) by mouth 2 (two) times daily with a meal. As needed for pain (Patient not taking: Reported on 05/29/2018)   . Prenatal MV & Min w/FA-DHA (PRENATAL ADULT GUMMY/DHA/FA) 0.4-25 MG CHEW Chew 1 tablet by mouth.   . Vitamin D, Ergocalciferol, (DRISDOL) 50000 units CAPS capsule Take 1 capsule (50,000 Units total) by mouth every 7 (seven) days. Take for 8 total doses(weeks) (Patient not taking: Reported on 05/29/2018)    No facility-administered encounter medications on file as of 08/31/2018.    Functional Status:   In your present state of health, do you have any difficulty performing the following activities: 05/29/2018  Hearing? N  Vision? N  Difficulty concentrating or making decisions? N  Walking or climbing stairs? N  Dressing or bathing? N  Doing errands, shopping? N  Preparing Food and eating ? N  Using the Toilet? N  In the past six months, have you accidently leaked urine? N  Do you have problems with loss of bowel control? N  Managing your Medications? N  Managing your Finances? N  Housekeeping or managing your Housekeeping? N  Some recent data might be hidden    Fall/Depression Screening:    No flowsheet data found. PHQ 2/9 Scores 01/28/2017  PHQ - 2  Score 0    Assessment:  [employer] employee with Type II DM and in second trimester of pregnancy with improved glycemic control and no additional complications of pregnancy  via consistant adherence to diabetes treatment plan, Wellsmith participation, provider appointments and communication with this RNCM.   Plan:  [facility] CM Care Plan Problem One     Most Recent Value  Care Plan Problem One  Knowledge deficit related to management of Type II DM during pregnancy  Role Documenting the Problem One  Care  Management Coordinator  Care Plan for Problem One  Active  THN Long Term Goal  In the next 90 days patient will demonstrate ongoing good control ofdiabetes during pregnancy as evidenced by: self-monitoring of blood sugars 5-6 times daily or as prescribed with 90% of blood sugars meeting target, continued adherence to Plate Method or carb controlled meal plan, adherence to provider appointments, continued active participation in the [facility] platform and continued adherence in contacting this RNCM at least monthly per program guidelines  THN Long Term Goal Start Date  08/28/18  Interventions for Problem One Long Term Goal This RNCM will continue to remain available to [facility] for any questions or concerns she has regarding her diabetes management in addition to the assistance provided by the clinical nurse, Susette Racer,  in the [employer], will continue to fax [employer] CBG reports to her endocrinologist per her request, will continue to update the clinical nurse support person in [place] when changes made to Karia's diabetes treatment plan,  routinely encourage patient to contact this RNCM for questions or concerns related to diabetes self-management during pregnancy.      Will provide ongoing assistance to  patient with diabetes self management during pregnancy and coordinate care with the clincal nurse in the [facility] platform.  [name] RN,CCM,CDE [facility] Care Management Coordinator Office Phone [phone] Office Fax [phone]

## 2018-09-03 MED FILL — UNIFINE PENTIPS 32GX5/32": 32G X 4 MM | 40 days supply | Qty: 200 | Fill #1

## 2018-09-03 MED FILL — UNIFINE PENTIPS 32GX5/32: 32G X 4 MM | 40 days supply | Qty: 200 | Fill #1

## 2018-09-07 ENCOUNTER — Other Ambulatory Visit: Payer: Self-pay | Admitting: *Deleted

## 2018-09-07 NOTE — Patient Outreach (Signed)
[facility](THN) Care Management  09/07/2018  [place] CharonBableen Hoffmaster 16-Jan-1986 [id number]  Jonathon ResidesBaleen is a member of the [facility] Care Management Gestational Diabetes Program Secure e-mail to [place] requesting permission to fax Wellsmith CBG report to her endocrinologist. She returned e-mail and gave permission so report faxed to Dr. Talmage NapBalan. Norma FredricksonBableen states she has her next appointment with Dr. Talmage NapBalan on 9/18 so this RNCM requested update including any changes to her diabetes plan of care.  Bary RichardJanet S. Hauser RN,CCM,CDE [facility] Care Management Coordinator Office Phone [phone] Office Fax [phone]

## 2018-09-08 NOTE — Patient Outreach (Signed)
[facility](THN) Care Management  09/08/2018  Cathy CharonBableen Daniels 1986/06/01 [id number]   See the other note of today. This note was mistakenly opened in the incorrect context.  Bary RichardJanet S. Britiney Blahnik RN,CCM,CDE [facility] Care Management Coordinator Office Phone [phone] Office Fax [phone]

## 2018-09-09 ENCOUNTER — Other Ambulatory Visit: Payer: Self-pay | Admitting: *Deleted

## 2018-09-09 MED FILL — LEVOTHYROXINE 112 MCG TAB: 112 | 30 days supply | Qty: 30 | Fill #3

## 2018-09-09 NOTE — Patient Outreach (Signed)
[facility](THN) Care Management  09/09/2018  [place] CharonBableen Karrer [birth year] [id number]   Received e-mail form Norma FredricksonBableen stating her appointment with her endocrinologist, Dr. Talmage NapBalan,  went well this morning and no changes were made to her diabetes treatment plan. At [facility]'s request, shared this information with Susette RacerJulie Montpellier, Teacher, adult educationWellsmith Solutions Manager RN as Norma FredricksonBableen is a member of [facility]'s  Wellsmith digital assistant platform.   Bary RichardJanet S. Hauser RN,CCM,CDE [facility] Care Management Coordinator Office Phone [phone] Office Fax [phone]

## 2018-09-23 ENCOUNTER — Other Ambulatory Visit: Payer: Self-pay | Admitting: *Deleted

## 2018-09-23 NOTE — Patient Outreach (Addendum)
[facility]) Care Management  09/23/2018  Cathy Daniels 12-Jan-1986 [id number]  Cathy Daniels is a member of the [facility] [employer] Care Management Gestational Diabetes Program. E-mail sent to [place] advising her this RNCM has received her 6 week CBG report from [place] and asking for verification she would like the report faxed to her endocrinologist Dr. Talmage Nap. Von responded via email stating she would like the report faxed. Report successfully faxed to Dr. Talmage Nap at 9:51 am. Notified [name] and Anna Genre  RN, CDE, [facility] Clinical Solutions Manager via e-mail of successful fax.  [name] RN,CCM,CDE [facility] Care Management Coordinator Office Phone [phone] Office Fax [phone]

## 2018-09-28 ENCOUNTER — Other Ambulatory Visit: Payer: Self-pay | Admitting: *Deleted

## 2018-09-28 NOTE — Patient Outreach (Signed)
[facility]) Care Management  09/28/2018  Cathy Daniels 01/17/1986 [id number]   Cathy Daniels is a member of the [facility] [employer] Care Management Gestational Diabetes Program. Secure e-mail sent to [place] advising her this RNCM has received her 6 week CBG report from [place] and that it was successfully faxed to her endocrinologists office, Dr Talmage Nap, at 11:21 am. [name] responded via e-mail advising no changes have been made to her current diabetes care plan by Dr Talmage Nap.  Secure e-mail to Anna Genre  RN, CDE, [employer] of successful fax of CBG report.[name] RN,CCM,CDE [facility] Care Management Coordinator Office Phone [phone] Office Fax [phone]

## 2018-09-30 MED FILL — HUMALOG 100 UNITS/ML KWIKPE: 100 | 30 days supply | Qty: 9 | Fill #0

## 2018-09-30 MED FILL — HUMULIN N 100 UNITS/ML KWIK: 100 | 87 days supply | Qty: 48 | Fill #0

## 2018-10-02 ENCOUNTER — Other Ambulatory Visit: Payer: Self-pay | Admitting: *Deleted

## 2018-10-02 NOTE — Patient Outreach (Signed)
[facility]) Care Management  10/02/2018  Cathy Daniels 1986/05/20 [id number]   Cathy Daniels is a member of the [facility] Care Management Gestational Diabetes Program. Her due date is 12/27/18.  Received e-mail from [facility] Daniels stating Dr. Talmage Nap increased her morning dose of NPH insulin from 40 units to 45 units to address elevated in blood sugars. Per Cathy Daniels's request notified Cathy Daniels Cathy Daniels solutions manager via secure e-mail so that care plan may be updated to reflect the insulin dosage change.  [name] RN,CCM,CDE [facility] Care Management Coordinator Office Phone [phone] Office Fax [phone]

## 2018-10-09 MED FILL — LEVOTHYROXINE 112 MCG TAB: 112 | 90 days supply | Qty: 90 | Fill #0

## 2018-10-19 ENCOUNTER — Other Ambulatory Visit: Payer: Self-pay | Admitting: *Deleted

## 2018-10-19 NOTE — Patient Outreach (Signed)
[facility]) Care Management  10/16/2018  Fiorella Hanahan 07-Sep-1986 [id number]   Cathy Daniels is a member of the [facility] Care Management Gestational Diabetes Program. Her due date is 12/27/18. She is also participating in [employer] program, [place], for self management assistance with her diabetes.   Received e-mail from [place] stating she saw Dr. Talmage Nap today ,10/25, and no changes were made to her diabetes regimen. She stated she remains on 45 units of NPH in the morning and 15 units of NPH at bedtime. Forwarded [employer] e-mail to [employer] RN,CDE, [employer] and also sent reply e-mail to [place] thanking her for keeping this RNCM updated on her diabetes treatment plan.   [name] RN,CCM,CDE [facility] Care Management Coordinator Office Phone [phone] Office Fax [phone]

## 2018-10-26 ENCOUNTER — Other Ambulatory Visit: Payer: Self-pay | Admitting: *Deleted

## 2018-10-26 NOTE — Patient Outreach (Signed)
[facility]) Care Management  10/26/2018  Lyndsay Talamante [birth year] [id number]  Jilleen is a member of the[facility] Care Management Gestational Diabetes Program. Her due date is 12/27/18. She is also participating in [employer] program, [place], for self management assistance with her diabetes.  Secure e-mail sent to [facility] advising her this RNCM has received her 6 week CBG report from [facility] and asking for verification she would like the report faxed to her endocrinologist Dr. Talmage Nap. Jeraldine responded via e-mail stating she would like the report faxed. Report successfully faxed to Dr. Talmage Nap at 9:46 am. Notified [name] and Anna Genre  RN, CDE, [facility] Clinical Solutions Manager via e-mail of successful fax. Will continue to assist with care coordination while Onnie remains in the gestational diabetes program.   [name] RN,CCM,CDE [facility] Care Management Coordinator Office Phone [phone] Office Fax [phone]

## 2018-10-28 MED FILL — UNIFINE PENTIPS 32GX5/32: 32G X 4 MM | 40 days supply | Qty: 200 | Fill #2

## 2018-10-28 MED FILL — UNIFINE PENTIPS 32GX5/32": 32G X 4 MM | 40 days supply | Qty: 200 | Fill #2

## 2018-10-28 MED FILL — ACCU-CHEK GUIDE TEST STRIP: 33 days supply | Qty: 200 | Fill #1

## 2018-11-02 ENCOUNTER — Other Ambulatory Visit: Payer: Self-pay | Admitting: *Deleted

## 2018-11-02 NOTE — Patient Outreach (Signed)
[facility]) Care Management  11/02/2018  Cathy Daniels [birth year] [id number]   Cathy Daniels is a member of the[facility] Care Management Gestational Diabetes Program. Her due date is 12/27/18.She is also participating in [employer] program, [place], for self management assistance with her diabetes. Received Cathy Daniels's 6 week CBG report and the report successfully faxed to Dr. Talmage Nap, [facility] endocrinologist at 2:40pm. Notified Cathy Daniels and Cathy Genre RN, CDE, [facility] Clinical Solutions Manager via e-mail of successful fax. Will continue to assist with care coordination while Cathy Daniels remains in the gestational diabetes program.   Cathy Richard RN,CCM,CDE [facility] Care Management Coordinator Office Phone [phone] Office Fax [phone]

## 2018-11-10 ENCOUNTER — Other Ambulatory Visit: Payer: Self-pay | Admitting: *Deleted

## 2018-11-10 NOTE — Patient Outreach (Signed)
[facility]) Care Management  11/10/2018  Cathy Daniels [place] 1986-09-22 [id number]   Cathy Daniels is a member of [facility] Care Management Gestational Diabetes Program. Her due date is 12/27/18.She is also participating in [facility] digital assistant program, [employer], for self management assistance with her diabetes. Received Doria's 6 week CBG report and the report successfully faxed to Dr. [facility]Balan, [facility] at  11:19 am. Notified Cathy Daniels and Cathy GenreJulie Montpelier RN, CDE, [facility] Clinical Solutions Manager via e-mail of successful fax. Will continue to assist with care coordination while Cathy Daniels remains in the gestational diabetes program.  Cathy RichardJanet S. Chade Pitner RN,CCM,CDE [facility] Care Management Coordinator Office Phone [phone] Office Fax [phone]

## 2018-11-17 ENCOUNTER — Other Ambulatory Visit: Payer: Self-pay | Admitting: *Deleted

## 2018-11-17 NOTE — Patient Outreach (Signed)
[facility]) Care Management  11/17/2018  Cathy CharonBableen Daniels 03-Apr-1986 [id number]   Cathy Daniels is a member of the [facility] Care Management Gestational Diabetes Program. Her due date is 12/27/18. Received secure e-mail from [place] stating she saw her endocrinologist, Dr Talmage NapBalan, today and no changes were made to her diabetes treatment plan. [name] asked that [employer]ie Montpellier,[employer] Solutions Manager, be updated.  Per [place]'s request notified Susette RacerJulie Montpellier via secure e-mail that no changes were made to [name]'s diabetes treatment plan by Dr Talmage NapBalan today. Will continue to provide care coordination and diabetes self management  assistance as needed.  Bary RichardJanet S. Aven Christen RN,CCM,CDE [facility] Care Management Coordinator Office Phone [phone] Office Fax [phone]

## 2018-12-03 LAB — OB RESULTS CONSOLE GBS: GBS: NEGATIVE

## 2018-12-22 ENCOUNTER — Encounter (HOSPITAL_COMMUNITY): Payer: Self-pay | Admitting: *Deleted

## 2018-12-22 ENCOUNTER — Inpatient Hospital Stay (HOSPITAL_COMMUNITY)
Admission: AD | Admit: 2018-12-22 | Discharge: 2018-12-25 | DRG: 807 | Disposition: A | Discharge status: Home / Self Care | Payer: No Typology Code available for payment source | Attending: Obstetrics and Gynecology | Admitting: Obstetrics and Gynecology

## 2018-12-22 ENCOUNTER — Telehealth (HOSPITAL_COMMUNITY): Payer: Self-pay | Admitting: *Deleted

## 2018-12-22 DIAGNOSIS — O2412 Pre-existing diabetes mellitus, type 2, in childbirth: Principal | ICD-10-CM | POA: Diagnosis present

## 2018-12-22 DIAGNOSIS — E039 Hypothyroidism, unspecified: Secondary | ICD-10-CM | POA: Diagnosis present

## 2018-12-22 DIAGNOSIS — O134 Gestational [pregnancy-induced] hypertension without significant proteinuria, complicating childbirth: Secondary | ICD-10-CM | POA: Diagnosis present

## 2018-12-22 DIAGNOSIS — O24113 Pre-existing diabetes mellitus, type 2, in pregnancy, third trimester: Secondary | ICD-10-CM

## 2018-12-22 DIAGNOSIS — O24913 Unspecified diabetes mellitus in pregnancy, third trimester: Secondary | ICD-10-CM | POA: Diagnosis present

## 2018-12-22 DIAGNOSIS — Z794 Long term (current) use of insulin: Secondary | ICD-10-CM | POA: Diagnosis not present

## 2018-12-22 DIAGNOSIS — E119 Type 2 diabetes mellitus without complications: Secondary | ICD-10-CM | POA: Diagnosis present

## 2018-12-22 DIAGNOSIS — O99284 Endocrine, nutritional and metabolic diseases complicating childbirth: Secondary | ICD-10-CM | POA: Diagnosis present

## 2018-12-22 DIAGNOSIS — O24119 Pre-existing diabetes mellitus, type 2, in pregnancy, unspecified trimester: Secondary | ICD-10-CM

## 2018-12-22 DIAGNOSIS — R030 Elevated blood-pressure reading, without diagnosis of hypertension: Secondary | ICD-10-CM

## 2018-12-22 DIAGNOSIS — Z3A39 39 weeks gestation of pregnancy: Secondary | ICD-10-CM | POA: Diagnosis not present

## 2018-12-22 DIAGNOSIS — R51 Headache: Secondary | ICD-10-CM | POA: Diagnosis present

## 2018-12-22 HISTORY — DX: Unspecified infectious disease: B99.9

## 2018-12-22 HISTORY — DX: Unspecified abnormal cytological findings in specimens from vagina: R87.629

## 2018-12-22 HISTORY — DX: Hypothyroidism, unspecified: E03.9

## 2018-12-22 LAB — CBC WITH DIFFERENTIAL/PLATELET
BASOS PCT: 0 %
Basophils Absolute: 0 10*3/uL (ref 0.0–0.1)
EOS ABS: 0.3 10*3/uL (ref 0.0–0.5)
Eosinophils Relative: 3 %
HCT: 36.4 % (ref 36.0–46.0)
Hemoglobin: 11.2 g/dL — ABNORMAL LOW (ref 12.0–15.0)
Lymphocytes Relative: 24 %
Lymphs Abs: 2.3 10*3/uL (ref 0.7–4.0)
MCH: 24.7 pg — ABNORMAL LOW (ref 26.0–34.0)
MCHC: 30.8 g/dL (ref 30.0–36.0)
MCV: 80.4 fL (ref 80.0–100.0)
Monocytes Absolute: 0.4 10*3/uL (ref 0.1–1.0)
Monocytes Relative: 4 %
NRBC: 0 % (ref 0.0–0.2)
Neutro Abs: 6.5 10*3/uL (ref 1.7–7.7)
Neutrophils Relative %: 69 %
Platelets: 282 10*3/uL (ref 150–400)
RBC: 4.53 MIL/uL (ref 3.87–5.11)
RDW: 17.1 % — ABNORMAL HIGH (ref 11.5–15.5)
WBC: 9.5 10*3/uL (ref 4.0–10.5)

## 2018-12-22 LAB — COMPREHENSIVE METABOLIC PANEL
ALBUMIN: 3 g/dL — AB (ref 3.5–5.0)
ALT: 13 U/L (ref 0–44)
AST: 15 U/L (ref 15–41)
Alkaline Phosphatase: 96 U/L (ref 38–126)
Anion gap: 7 (ref 5–15)
BUN: 7 mg/dL (ref 6–20)
CHLORIDE: 106 mmol/L (ref 98–111)
CO2: 20 mmol/L — AB (ref 22–32)
Calcium: 8.7 mg/dL — ABNORMAL LOW (ref 8.9–10.3)
Creatinine, Ser: 0.52 mg/dL (ref 0.44–1.00)
GFR calc Af Amer: >60 mL/min (ref >60)
GFR calc non Af Amer: >60 mL/min (ref >60)
Glucose, Bld: 82 mg/dL (ref 70–99)
Potassium: 3.8 mmol/L (ref 3.5–5.1)
Sodium: 133 mmol/L — ABNORMAL LOW (ref 135–145)
Total Bilirubin: 0.5 mg/dL (ref 0.3–1.2)
Total Protein: 6.9 g/dL (ref 6.5–8.1)

## 2018-12-22 LAB — URINALYSIS, ROUTINE W REFLEX MICROSCOPIC
Bilirubin Urine: NEGATIVE
Glucose, UA: NEGATIVE mg/dL
Hgb urine dipstick: NEGATIVE
Ketones, ur: NEGATIVE mg/dL
Leukocytes, UA: NEGATIVE
Nitrite: NEGATIVE
Protein, ur: NEGATIVE mg/dL
Specific Gravity, Urine: 1.009 (ref 1.005–1.030)
pH: 6 (ref 5.0–8.0)

## 2018-12-22 LAB — GLUCOSE, CAPILLARY: Glucose-Capillary: 71 mg/dL (ref 70–99)

## 2018-12-22 LAB — PROTEIN / CREATININE RATIO, URINE
Creatinine, Urine: 53 mg/dL
Total Protein, Urine: <6 mg/dL

## 2018-12-22 LAB — TYPE AND SCREEN
ABO/RH(D): B POS
Antibody Screen: NEGATIVE

## 2018-12-22 MED ORDER — LIDOCAINE HCL (PF) 1 % IJ SOLN
30.0000 mL | INTRAMUSCULAR | Status: DC | PRN
  Filled 2018-12-22: qty 30

## 2018-12-22 MED ORDER — OXYCODONE-ACETAMINOPHEN 5-325 MG PO TABS: 2.0000 | ORAL_TABLET | ORAL | Status: DC | PRN

## 2018-12-22 MED ORDER — MISOPROSTOL 25 MCG QUARTER TABLET
25.0000 ug | ORAL_TABLET | ORAL | Status: DC | PRN
  Administered 2018-12-22 – 2018-12-23 (×3): 25 ug via VAGINAL
  Filled 2018-12-22 (×4): qty 1

## 2018-12-22 MED ORDER — OXYTOCIN BOLUS FROM INFUSION
500.0000 mL | Freq: Once | INTRAVENOUS | Status: AC
  Administered 2018-12-23: 500 mL via INTRAVENOUS

## 2018-12-22 MED ORDER — OXYTOCIN 40 UNITS IN LACTATED RINGERS INFUSION - SIMPLE MED
2.5000 [IU]/h | INTRAVENOUS | Status: DC
  Filled 2018-12-22: qty 1000

## 2018-12-22 MED ORDER — DEXTROSE IN LACTATED RINGERS 5 % IV SOLN: INTRAVENOUS | Status: DC

## 2018-12-22 MED ORDER — OXYCODONE-ACETAMINOPHEN 5-325 MG PO TABS: 1.0000 | ORAL_TABLET | ORAL | Status: DC | PRN

## 2018-12-22 MED ORDER — TERBUTALINE SULFATE 1 MG/ML IJ SOLN
0.2500 mg | Freq: Once | INTRAMUSCULAR | Status: DC | PRN
  Filled 2018-12-22: qty 1

## 2018-12-22 MED ORDER — ACETAMINOPHEN 325 MG PO TABS: 650.0000 mg | ORAL_TABLET | ORAL | Status: DC | PRN

## 2018-12-22 MED ORDER — INSULIN REGULAR(HUMAN) IN NACL 100-0.9 UT/100ML-% IV SOLN
INTRAVENOUS | Status: DC
  Filled 2018-12-22: qty 100

## 2018-12-22 MED ORDER — BUTORPHANOL TARTRATE 1 MG/ML IJ SOLN
1.0000 mg | INTRAMUSCULAR | Status: DC | PRN
  Administered 2018-12-23 (×2): 1 mg via INTRAVENOUS
  Filled 2018-12-22 (×2): qty 1

## 2018-12-22 MED ORDER — ONDANSETRON HCL 4 MG/2ML IJ SOLN: 4.0000 mg | Freq: Four times a day (QID) | INTRAMUSCULAR | Status: DC | PRN

## 2018-12-22 MED ORDER — LACTATED RINGERS IV SOLN: 500.0000 mL | INTRAVENOUS | Status: DC | PRN

## 2018-12-22 MED ORDER — SOD CITRATE-CITRIC ACID 500-334 MG/5ML PO SOLN: 30.0000 mL | ORAL | Status: DC | PRN

## 2018-12-22 MED ORDER — LACTATED RINGERS IV SOLN
INTRAVENOUS | Status: DC
  Administered 2018-12-22: 20:00:00 via INTRAVENOUS
  Administered 2018-12-23: 1000 mL via INTRAVENOUS

## 2018-12-22 NOTE — MAU Provider Note (Signed)
History     CSN: [id number]  Arrival date and time: 12/22/18 1647   First Provider Initiated Contact with Patient 12/22/18 1745      Chief Complaint  Patient presents with  . Hypertension   HPI Cathy Daniels is a 32 y.o. G2P1001 at 8640w2d who presents to MAU today with complaint of elevated blood pressure. The patient is a T2DM on insulin, has hypothyroidism and recent elevated blood pressure. She had elevated blood pressure last week in the office and per patient labs were normal. She was rechecked in the office yesterday and normotensive. She checked her BP at home today and it ws elevated. She has a headache that has improved, but not resolved with Tylenol. She denies blurred vision, RUQ abdominal pain, or significant LE edema. She denies vaginal bleeding, LOF. She reports normal fetal movement and only irregular contractions initially, but more consistent while waiting in MAU.   OB History    Gravida  2   Para  1   Term  1   Preterm      AB      Living  1     SAB      TAB      Ectopic      Multiple      Live Births  1           Past Medical History:  Diagnosis Date  . Abnormal pap 2007, 2008   colpo x 3  . Abnormal Pap smear    colpo- ok since  . Anemia   . Asthma    smoke induced, no inhaler used in  over a year  . Genital warts   . GERD (gastroesophageal reflux disease)   . Gestational diabetes   . Hypothyroid   . Infection 2017   UTI  . Pregnancy induced hypertension   . Seasonal allergies   . Vaginal Pap smear, abnormal    had colpo- normal since    Past Surgical History:  Procedure Laterality Date  . COLPOSCOPY  3/07, 8/07, 1/08   CIN II, benign, CIN I  . NO PAST SURGERIES    . WISDOM TOOTH EXTRACTION      Family History  Problem Relation Age of Onset  . Diabetes Father   . Hypertension Mother   . Diabetes Other        PU x 5, PA x 1  . Glaucoma Maternal Grandmother   . Hypertension Maternal Grandfather   . Hearing loss  Neg Hx     Social History   Tobacco Use  . Smoking status: Never Smoker  . Smokeless tobacco: Never Used  Substance Use Topics  . Alcohol use: No  . Drug use: No    Allergies:  Allergies  Allergen Reactions  . Ferrous Sulfate Hives    Repliva OK  . Sulfonamide Derivatives Hives  . Latex Hives, Itching and Rash    Itching and rash when wearing latex gloves    Medications Prior to Admission  Medication Sig Dispense Refill Last Dose  . budesonide (PULMICORT) 180 MCG/ACT inhaler Inhale 1 puff into the lungs 2 (two) times daily as needed (For asthma).   Not Taking  . DICLEGIS 10-10 MG TBEC   2 Taking  . ferrous gluconate (FERGON) 324 MG tablet Take 1 tablet (324 mg total) by mouth 3 (three) times daily with meals. 180 tablet 3 Taking  . insulin lispro (HUMALOG) 100 UNIT/ML injection Inject 3 Units into the skin once.  Taking  . insulin lispro (HUMALOG) 100 UNIT/ML injection Inject 4 Units into the skin once.   Taking  . insulin NPH Human (HUMULIN N,NOVOLIN N) 100 UNIT/ML injection Inject 45 Units into the skin daily before breakfast.    Taking  . insulin NPH Human (HUMULIN N,NOVOLIN N) 100 UNIT/ML injection Inject 15 Units into the skin daily before supper.   Taking  . levothyroxine (SYNTHROID, LEVOTHROID) 88 MCG tablet TAKE 1 TABLET BY MOUTH ONCE DAILY BEFORE BREAKFAST 90 tablet 1 Taking  . metFORMIN (GLUCOPHAGE-XR) 500 MG 24 hr tablet   6 Not Taking  . naproxen (NAPROSYN) 500 MG tablet Take 1 tablet (500 mg total) by mouth 2 (two) times daily with a meal. As needed for pain (Patient not taking: Reported on 05/29/2018) 60 tablet 2 Not Taking  . Prenatal MV & Min w/FA-DHA (PRENATAL ADULT GUMMY/DHA/FA) 0.4-25 MG CHEW Chew 1 tablet by mouth.   Taking  . Vitamin D, Ergocalciferol, (DRISDOL) 50000 units CAPS capsule Take 1 capsule (50,000 Units total) by mouth every 7 (seven) days. Take for 8 total doses(weeks) (Patient not taking: Reported on 05/29/2018) 8 capsule 1 Not Taking    Review  of Systems  Constitutional: Negative for fever.  Eyes: Negative for visual disturbance.  Respiratory: Negative for chest tightness.   Cardiovascular: Positive for leg swelling.  Gastrointestinal: Negative for abdominal pain, constipation, diarrhea, nausea and vomiting.  Genitourinary: Negative for vaginal bleeding and vaginal discharge.  Neurological: Positive for headaches.   Physical Exam   Blood pressure 134/81, pulse 82, temperature 97.8 F (36.6 C), resp. rate 18, height 5\' 3"  (1.6 m), weight 108.4 kg, SpO2 100 %, unknown if currently breastfeeding.  Physical Exam  Nursing note and vitals reviewed. Constitutional: She is oriented to person, place, and time. She appears well-developed and well-nourished. No distress.  HENT:  Head: Normocephalic and atraumatic.  Cardiovascular: Normal rate.  Respiratory: Effort normal.  GI: Soft. She exhibits no distension and no mass. There is no abdominal tenderness. There is no rebound and no guarding.  Neurological: She is alert and oriented to person, place, and time.  Skin: Skin is warm and dry. No erythema.  Psychiatric: She has a normal mood and affect.    Results for orders placed or performed during the hospital encounter of 12/22/18 (from the past 24 hour(s))  Urinalysis, Routine w reflex microscopic     Status: Abnormal   Collection Time: 12/22/18  5:40 PM  Result Value Ref Range   Color, Urine YELLOW YELLOW   APPearance HAZY (A) CLEAR   Specific Gravity, Urine 1.009 1.005 - 1.030   pH 6.0 5.0 - 8.0   Glucose, UA NEGATIVE NEGATIVE mg/dL   Hgb urine dipstick NEGATIVE NEGATIVE   Bilirubin Urine NEGATIVE NEGATIVE   Ketones, ur NEGATIVE NEGATIVE mg/dL   Protein, ur NEGATIVE NEGATIVE mg/dL   Nitrite NEGATIVE NEGATIVE   Leukocytes, UA NEGATIVE NEGATIVE  CBC with Differential/Platelet     Status: Abnormal (Preliminary result)   Collection Time: 12/22/18  6:20 PM  Result Value Ref Range   WBC 9.5 4.0 - 10.5 K/uL   RBC 4.53 3.87 -  5.11 MIL/uL   Hemoglobin 11.2 (L) 12.0 - 15.0 g/dL   HCT 16.1 09.6 - 04.5 %   MCV 80.4 80.0 - 100.0 fL   MCH 24.7 (L) 26.0 - 34.0 pg   MCHC 30.8 30.0 - 36.0 g/dL   RDW 40.9 (H) 81.1 - 91.4 %   Platelets 282 150 - 400 K/uL  nRBC 0.0 0.0 - 0.2 %   Neutrophils Relative % 69 %   Neutro Abs 6.5 1.7 - 7.7 K/uL   Lymphocytes Relative 24 %   Lymphs Abs 2.3 0.7 - 4.0 K/uL   Monocytes Relative 4 %   Monocytes Absolute 0.4 0.1 - 1.0 K/uL   Eosinophils Relative 3 %   Eosinophils Absolute 0.3 0.0 - 0.5 K/uL   Basophils Relative 0 %   Basophils Absolute 0.0 0.0 - 0.1 K/uL   Other PENDING %    Patient Vitals for the past 24 hrs:  BP Temp Pulse Resp SpO2 Height Weight  12/22/18 1813 134/81 - 82 - - - -  12/22/18 1801 113/77 - 82 - - - -  12/22/18 1746 127/78 - 85 - - - -  12/22/18 1737 129/79 - 88 - 100 % - -  12/22/18 1711 130/82 97.8 F (36.6 C) 83 18 - 5\' 3"  (1.6 m) 108.4 kg    MAU Course  Procedures None  MDM Discussed patient with Dr. Adrian BlackwaterStinson. Agrees with need for admission given T2DM on insulin and transient HTN at > [redacted] weeks GA.  Advised Dr. Jackelyn KnifeMeisinger of admission to L&D for IOL.   Assessment and Plan  A: SIUP at 8045w2d Transient HTN in pregnancy, third trimester T2DM  P:  Admit for IOL  Vonzella NippleJulie Elize Pinon, [name] 12/22/2018, 6:43 PM

## 2018-12-22 NOTE — MAU Note (Signed)
Pt sent from by MD for elevated B/P . Had a few elevated pressures last week. None yesterday at office visit. Elevated b/p at home today. 140/90 129/86. C/O headache and cold symptoms. Took 325mg   Tylenol with no relief. Good fetal movement reported. No visual changes.

## 2018-12-22 NOTE — Anesthesia Pain Management Evaluation Note (Signed)
  [facility] Pain Management Visit Note  Patient: Cathy Daniels, 32 y.o., female  "Hello [facility] am a [facility]. We have an anesthesia team available at all times to provide care throughout the hospital, including epidural management and anesthesia for C-section. I don't know your plan for the delivery whether it a natural birth, water birth, IV sedation, nitrous supplementation, doula or epidural, but we want to meet your pain goals."   1.Was your pain managed to your expectations on prior hospitalizations?   Yes   2.What is your expectation for pain management during this hospitalization?     Epidural  3.How can we help you reach that goal? epidural  Record the patient's initial score and the patient's pain goal.   Pain: 2  Pain Goal: 5 [facility] wants you to be able to say your pain was always managed very well.  Madelyn Tlatelpa 12/22/2018

## 2018-12-22 NOTE — Telephone Encounter (Signed)
Preadmission screen  

## 2018-12-23 ENCOUNTER — Inpatient Hospital Stay (HOSPITAL_COMMUNITY): Payer: No Typology Code available for payment source | Admitting: Anesthesiology

## 2018-12-23 ENCOUNTER — Encounter (HOSPITAL_COMMUNITY): Payer: Self-pay | Admitting: *Deleted

## 2018-12-23 LAB — RPR: RPR Ser Ql: NONREACTIVE

## 2018-12-23 LAB — GLUCOSE, CAPILLARY
Glucose-Capillary: 195 mg/dL — ABNORMAL HIGH (ref 70–99)
Glucose-Capillary: 77 mg/dL (ref 70–99)
Glucose-Capillary: 83 mg/dL (ref 70–99)
Glucose-Capillary: 86 mg/dL (ref 70–99)
Glucose-Capillary: 86 mg/dL (ref 70–99)

## 2018-12-23 MED ORDER — EPHEDRINE 5 MG/ML INJ
10.0000 mg | INTRAVENOUS | Status: DC | PRN
  Filled 2018-12-23: qty 2

## 2018-12-23 MED ORDER — BENZOCAINE-MENTHOL 20-0.5 % EX AERO
1.0000 "application " | INHALATION_SPRAY | CUTANEOUS | Status: DC | PRN
  Filled 2018-12-23: qty 56

## 2018-12-23 MED ORDER — LIDOCAINE HCL (PF) 1 % IJ SOLN
INTRAMUSCULAR | Status: DC | PRN
  Administered 2018-12-23: 13 mL via EPIDURAL

## 2018-12-23 MED ORDER — DIBUCAINE 1 % RE OINT: 1.0000 "application " | TOPICAL_OINTMENT | RECTAL | Status: DC | PRN

## 2018-12-23 MED ORDER — OXYTOCIN 40 UNITS IN LACTATED RINGERS INFUSION - SIMPLE MED
1.0000 m[IU]/min | INTRAVENOUS | Status: DC
  Administered 2018-12-23: 2 m[IU]/min via INTRAVENOUS

## 2018-12-23 MED ORDER — SENNOSIDES-DOCUSATE SODIUM 8.6-50 MG PO TABS
2.0000 | ORAL_TABLET | ORAL | Status: DC
  Administered 2018-12-23 – 2018-12-25 (×2): 2 via ORAL
  Filled 2018-12-23 (×2): qty 2

## 2018-12-23 MED ORDER — LACTATED RINGERS AMNIOINFUSION
INTRAVENOUS | Status: DC
  Filled 2018-12-23 (×2): qty 1000

## 2018-12-23 MED ORDER — FENTANYL 2.5 MCG/ML BUPIVACAINE 1/10 % EPIDURAL INFUSION (WH - ANES)
14.0000 mL/h | INTRAMUSCULAR | Status: DC | PRN
  Administered 2018-12-23 (×2): 14 mL/h via EPIDURAL
  Filled 2018-12-23 (×2): qty 100

## 2018-12-23 MED ORDER — DIPHENHYDRAMINE HCL 50 MG/ML IJ SOLN: 12.5000 mg | INTRAMUSCULAR | Status: DC | PRN

## 2018-12-23 MED ORDER — IBUPROFEN 600 MG PO TABS
600.0000 mg | ORAL_TABLET | Freq: Four times a day (QID) | ORAL | Status: DC
  Administered 2018-12-23 – 2018-12-25 (×6): 600 mg via ORAL
  Filled 2018-12-23 (×6): qty 1

## 2018-12-23 MED ORDER — DIPHENHYDRAMINE HCL 25 MG PO CAPS: 25.0000 mg | ORAL_CAPSULE | Freq: Four times a day (QID) | ORAL | Status: DC | PRN

## 2018-12-23 MED ORDER — OXYCODONE HCL 5 MG PO TABS: 10.0000 mg | ORAL_TABLET | ORAL | Status: DC | PRN

## 2018-12-23 MED ORDER — ONDANSETRON HCL 4 MG PO TABS: 4.0000 mg | ORAL_TABLET | ORAL | Status: DC | PRN

## 2018-12-23 MED ORDER — OXYCODONE HCL 5 MG PO TABS: 5.0000 mg | ORAL_TABLET | ORAL | Status: DC | PRN

## 2018-12-23 MED ORDER — PHENYLEPHRINE 40 MCG/ML (10ML) SYRINGE FOR IV PUSH (FOR BLOOD PRESSURE SUPPORT)
80.0000 ug | PREFILLED_SYRINGE | INTRAVENOUS | Status: DC | PRN
  Filled 2018-12-23 (×3): qty 10

## 2018-12-23 MED ORDER — WITCH HAZEL-GLYCERIN EX PADS: 1.0000 "application " | MEDICATED_PAD | CUTANEOUS | Status: DC | PRN

## 2018-12-23 MED ORDER — LACTATED RINGERS IV SOLN: 500.0000 mL | Freq: Once | INTRAVENOUS | Status: DC

## 2018-12-23 MED ORDER — ONDANSETRON HCL 4 MG/2ML IJ SOLN: 4.0000 mg | INTRAMUSCULAR | Status: DC | PRN

## 2018-12-23 MED ORDER — ZOLPIDEM TARTRATE 5 MG PO TABS: 5.0000 mg | ORAL_TABLET | Freq: Every evening | ORAL | Status: DC | PRN

## 2018-12-23 MED ORDER — TETANUS-DIPHTH-ACELL PERTUSSIS 5-2.5-18.5 LF-MCG/0.5 IM SUSP: 0.5000 mL | Freq: Once | INTRAMUSCULAR | Status: DC

## 2018-12-23 MED ORDER — ACETAMINOPHEN 325 MG PO TABS
650.0000 mg | ORAL_TABLET | ORAL | Status: DC | PRN
  Administered 2018-12-23 – 2018-12-24 (×4): 650 mg via ORAL
  Filled 2018-12-23 (×4): qty 2

## 2018-12-23 MED ORDER — COCONUT OIL OIL
1.0000 "application " | TOPICAL_OIL | Status: DC | PRN
  Administered 2018-12-25: 1 via TOPICAL
  Filled 2018-12-23: qty 120

## 2018-12-23 MED ORDER — PHENYLEPHRINE 40 MCG/ML (10ML) SYRINGE FOR IV PUSH (FOR BLOOD PRESSURE SUPPORT)
80.0000 ug | PREFILLED_SYRINGE | INTRAVENOUS | Status: DC | PRN
  Administered 2018-12-23 (×2): 80 ug via INTRAVENOUS
  Filled 2018-12-23: qty 10

## 2018-12-23 MED ORDER — TERBUTALINE SULFATE 1 MG/ML IJ SOLN: 0.2500 mg | Freq: Once | INTRAMUSCULAR | Status: DC | PRN

## 2018-12-23 MED ORDER — LEVOTHYROXINE SODIUM 88 MCG PO TABS
88.0000 ug | ORAL_TABLET | Freq: Every day | ORAL | Status: DC
  Administered 2018-12-24 – 2018-12-25 (×2): 88 ug via ORAL
  Filled 2018-12-23 (×2): qty 1

## 2018-12-23 MED ORDER — SIMETHICONE 80 MG PO CHEW: 80.0000 mg | CHEWABLE_TABLET | ORAL | Status: DC | PRN

## 2018-12-23 NOTE — L&D Delivery Note (Signed)
Delivery Note Pt labored down to a +2 station and then pushed about an hour.  At 4:51 PM a healthy female was delivered via Vaginal, Spontaneous (Presentation: ROA  ).  APGAR: 9, 9; weight peniding .   Placenta status: delivered spontaneously.  Cord:  with the following complications:tight nuchal x 1 delivered through.   Anesthesia:  epidural Episiotomy: None Lacerations: 2nd degree Suture Repair: 3.0 vicryl rapide Est. Blood Loss (mL):   Mom to postpartum.  Baby to Couplet care / Skin to Skin.  Oliver Pila 12/23/2018, 5:29 PM

## 2018-12-23 NOTE — Anesthesia Procedure Notes (Signed)
Epidural Patient location during procedure: OB Start time: 12/23/2018 8:25 AM End time: 12/23/2018 8:41 AM  Staffing Anesthesiologist: Lowella Curb, MD Performed: anesthesiologist   Preanesthetic Checklist Completed: patient identified, site marked, surgical consent, pre-op evaluation, timeout performed, IV checked, risks and benefits discussed and monitors and equipment checked  Epidural Patient position: sitting Prep: ChloraPrep Patient monitoring: heart rate, cardiac monitor, continuous pulse ox and blood pressure Approach: midline Location: L2-L3 Injection technique: LOR saline  Needle:  Needle type: Tuohy  Needle gauge: 17 G Needle length: 9 cm Needle insertion depth: 6 cm Catheter type: closed end flexible Catheter size: 20 Guage Catheter at skin depth: 10 cm Test dose: negative  Assessment Events: blood not aspirated, injection not painful, no injection resistance, negative IV test and no paresthesia  Additional Notes Reason for block:procedure for pain

## 2018-12-23 NOTE — Progress Notes (Signed)
Patient ID: Cathy Daniels, female   DOB: 14-Jun-1986, 33 y.o.   MRN: [id number] Pt feeling less discomfort at foley  afeb VSS BP WNL  Cervix 70/2-3/-2  IUPC and FSE placed

## 2018-12-23 NOTE — Progress Notes (Signed)
Patient ID: Cathy Daniels, female   DOB: 09-Feb-1986, 34 y.o.   MRN: [id number] Pt received 2 cytotec overnight and got very uncomfortable, she then received an epidural and is now comfortable except c/o foley burning  BP WNL FHR category 1 All BS WNL  Cervix 2/60/-3 Foley in normal position  Pt had SROM while being examined-clear Will start pitocin and place internals in next 1-2 hours to assess contraction strength

## 2018-12-23 NOTE — Progress Notes (Signed)
Patient ID: Cathy Daniels, female   DOB: [birth year], 33 y.o.   MRN: [id number] Pt feeling pressure  afeb VSS FHR baseline 120 with mild variable decels  Cervix with left lip that reduces with pushing but still at +1 station, Attempted a few pushes with slow progress, so will allow pt to labor down a bit lower then resume pushing.

## 2018-12-23 NOTE — Anesthesia Preprocedure Evaluation (Signed)
Anesthesia Evaluation  Patient identified by MRN, date of birth, ID band Patient awake    Reviewed: Allergy & Precautions, H&P , NPO status , Patient's Chart, lab work & pertinent test results  Airway Mallampati: II  TM Distance: >3 FB Neck ROM: full    Dental no notable dental hx.    Pulmonary    Pulmonary exam normal breath sounds clear to auscultation       Cardiovascular hypertension, negative cardio ROS Normal cardiovascular exam Rhythm:Regular Rate:Normal     Neuro/Psych negative neurological ROS  negative psych ROS   GI/Hepatic Neg liver ROS,   Endo/Other  diabetes, Well Controlled, Gestational, Insulin Dependent, Oral Hypoglycemic Agents  Renal/GU negative Renal ROS     Musculoskeletal   Abdominal Normal abdominal exam  (+)   Peds  Hematology   Anesthesia Other Findings   Reproductive/Obstetrics (+) Pregnancy                             Anesthesia Physical  Anesthesia Plan  ASA: III  Anesthesia Plan: Epidural   Post-op Pain Management:    Induction:   PONV Risk Score and Plan:   Airway Management Planned:   Additional Equipment:   Intra-op Plan:   Post-operative Plan:   Informed Consent: I have reviewed the patients History and Physical, chart, labs and discussed the procedure including the risks, benefits and alternatives for the proposed anesthesia with the patient or authorized representative who has indicated his/her understanding and acceptance.     Plan Discussed with:   Anesthesia Plan Comments:         Anesthesia Quick Evaluation

## 2018-12-23 NOTE — Lactation Note (Signed)
This note was copied from a baby's chart. Lactation Consultation Note  Patient Name: Cathy Daniels [id number] Date: 12/23/2018 Reason for consult: Initial assessment;Term  P2 mother whose infant is now 52 hours old.  Mother breast fed her first child (now 33 years old) for 3 months but pumped and bottle fed for an additional 7 months  Baby was asleep when I arrived and mother stated she recently breast fed for 10 minutes.  Mother felt like she latched well and denied pain with latching.  Encouraged to feed 8-12 times/24 hours or sooner if baby shows cues.  Reviewed feeding cues.  Mother is familiar with hand expression.  Colostrum container provided for any EBM she may obtain with hand expression.  Milk storage times reviewed.  Visitors are present so I did not physically observe hand expression with her at this time.  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  Paperwork completed and placed in folder in clean utility room.  Mom made aware of O/P services, breastfeeding support groups, community resources, and our phone # for post-discharge   questions. Mother will call for latch assistance as needed.  Family and visitors present.   Maternal Data Formula Feeding for Exclusion: No Has patient been taught Hand Expression?: Yes Does the patient have breastfeeding experience prior to this delivery?: Yes  Feeding Feeding Type: Breast Fed  LATCH Score Latch: Repeated attempts needed to sustain latch, nipple held in mouth throughout feeding, stimulation needed to elicit sucking reflex.  Audible Swallowing: A few with stimulation  Type of Nipple: Everted at rest and after stimulation  Comfort (Breast/Nipple): Soft / non-tender  Hold (Positioning): Assistance needed to correctly position infant at breast and maintain latch.  LATCH Score: 7  Interventions Interventions: Assisted with latch;Breast feeding basics reviewed;Hand express  Lactation Tools  Discussed/Used WIC Program: No   Consult Status Consult Status: Follow-up Date: 12/24/18 Follow-up type: In-patient    Dora Sims 12/23/2018, 7:51 PM

## 2018-12-23 NOTE — H&P (Signed)
Cathy Daniels is a 33 y.o. female, G2 P1001, EGA 39+ weeks with EDC 1-5 presenting for headache and possible elevated BP.  She has had intermittent mildly elevated BP in the office, also checking at home.  Today had unrelenting headache so came to MAU.  Most BP normal, rare slightly elevated, HA improved.  Prenatal care complicated by Type 2 DM controlled with insulin, hypothyroidism.  OB History    Gravida  2   Para  1   Term  1   Preterm      AB      Living  1     SAB      TAB      Ectopic      Multiple      Live Births  1          Past Medical History:  Diagnosis Date  . Abnormal pap 2007, 2008   colpo x 3  . Abnormal Pap smear    colpo- ok since  . Anemia   . Asthma    smoke induced, no inhaler used in  over a year  . Genital warts   . GERD (gastroesophageal reflux disease)   . Gestational diabetes   . Hypothyroid   . Infection 2017   UTI  . Pregnancy induced hypertension   . Seasonal allergies   . Vaginal Pap smear, abnormal    had colpo- normal since   Past Surgical History:  Procedure Laterality Date  . COLPOSCOPY  3/07, 8/07, 1/08   CIN II, benign, CIN I  . NO PAST SURGERIES    . WISDOM TOOTH EXTRACTION     Family History: family history includes Diabetes in her father and another family member; Glaucoma in her maternal grandmother; Hypertension in her maternal grandfather and mother. Social History:  reports that she has never smoked. She has never used smokeless tobacco. She reports that she does not drink alcohol or use drugs.     Maternal Diabetes: Yes:  Diabetes Type:  Pre-pregnancy, Insulin/Medication controlled Genetic Screening: Normal Maternal Ultrasounds/Referrals: Normal Fetal Ultrasounds or other Referrals:  None Maternal Substance Abuse:  No Significant Maternal Medications:  Meds include: Other:  Significant Maternal Lab Results:  Lab values include: Group B Strep negative Other Comments:  Insulin, synthroid  Review of  Systems  Respiratory: Negative.   Cardiovascular: Negative.    Maternal Medical History:  Contractions: Perceived severity is mild.    Fetal activity: Perceived fetal activity is normal.    Prenatal Complications - Diabetes: type 2. Diabetes is managed by insulin injections.      Dilation: 1 Effacement (%): Thick Station: -3 Exam by:: Grover Canavan, RN Blood pressure 127/79, pulse 89, temperature 98 F (36.7 C), temperature source Oral, resp. rate 18, height 5\' 3"  (1.6 m), weight 108.4 kg, SpO2 100 %, unknown if currently breastfeeding. Maternal Exam:  Uterine Assessment: Contraction strength is mild.  Contraction frequency is irregular.   Abdomen: Patient reports no abdominal tenderness. Estimated fetal weight is 8 lbs.   Fetal presentation: vertex  Introitus: Normal vulva. Normal vagina.  Amniotic fluid character: not assessed.  Pelvis: adequate for delivery.   Cervix: Cervix evaluated by digital exam.     Fetal Exam Fetal Monitor Review: Mode: ultrasound.   Baseline rate: 120.  Variability: moderate (6-25 bpm).   Pattern: accelerations present and no decelerations.    Fetal State Assessment: Category I - tracings are normal.     Physical Exam  Vitals reviewed. Constitutional: She appears well-developed  and well-nourished.  Cardiovascular: Normal rate and regular rhythm.  Respiratory: Effort normal. No respiratory distress.  GI: Soft.  Genitourinary:    Vulva normal.     Prenatal labs: ABO, Rh: --/--/B POS (12/31 1820) Antibody: NEG (12/31 1820) Rubella: Immune (06/17 0000) RPR: Nonreactive (06/17 0000)  HBsAg: Negative (06/17 0000)  HIV: Non-reactive (06/17 0000)  GBS: Negative (12/12 0000)   Assessment/Plan: IUP at 39+ weeks, Type 2 DM controlled with insulin, possible PIH with normal labs but significant headache this morning.  On eval in MAU, BP almost all normal, labs normal.  She was given the option to go home and continue current plan for  induction next week since cervix is unfavorable, or to come in tonight to start cervical ripening.  She has chosen to be admitted now and is receiving cytotec for ripening and is on glucose stabilizer protocol for her DM   Zenaida Niece 12/23/2018, 12:19 AM

## 2018-12-24 LAB — CBC
HCT: 31.2 % — ABNORMAL LOW (ref 36.0–46.0)
Hemoglobin: 9.8 g/dL — ABNORMAL LOW (ref 12.0–15.0)
MCH: 25.2 pg — ABNORMAL LOW (ref 26.0–34.0)
MCHC: 31.4 g/dL (ref 30.0–36.0)
MCV: 80.2 fL (ref 80.0–100.0)
Platelets: 257 10*3/uL (ref 150–400)
RBC: 3.89 MIL/uL (ref 3.87–5.11)
RDW: 17 % — ABNORMAL HIGH (ref 11.5–15.5)
WBC: 16.4 10*3/uL — ABNORMAL HIGH (ref 4.0–10.5)
nRBC: 0 % (ref 0.0–0.2)

## 2018-12-24 LAB — GLUCOSE, CAPILLARY
Glucose-Capillary: 72 mg/dL (ref 70–99)
Glucose-Capillary: 139 mg/dL — ABNORMAL HIGH (ref 70–99)
Glucose-Capillary: 142 mg/dL — ABNORMAL HIGH (ref 70–99)
Glucose-Capillary: 113 mg/dL — ABNORMAL HIGH (ref 70–99)

## 2018-12-24 MED ORDER — MENTHOL 3 MG MT LOZG
1.0000 | LOZENGE | OROMUCOSAL | Status: DC | PRN
  Administered 2018-12-24: 3 mg via ORAL
  Filled 2018-12-24: qty 9

## 2018-12-24 MED ORDER — HYDROCORTISONE 1 % EX CREA
TOPICAL_CREAM | Freq: Three times a day (TID) | CUTANEOUS | Status: DC
  Administered 2018-12-24 (×2): via TOPICAL
  Filled 2018-12-24: qty 28

## 2018-12-24 NOTE — Progress Notes (Addendum)
Post Partum Day 1 Subjective: no complaints, up ad lib, voiding, tolerating PO and nl lochia, pain controlled  Objective: Blood pressure 99/68, pulse 81, temperature 98 F (36.7 C), temperature source Oral, resp. rate 16, height 5\' 3"  (1.6 m), weight 108.4 kg, SpO2 100 %, unknown if currently breastfeeding.  Physical Exam:  General: alert and no distress Lochia: appropriate Uterine Fundus: firm   Recent Labs    12/22/18 1820 12/24/18 0353  HGB 11.2* 9.8*  HCT 36.4 31.2*   CBG 70-80's, had one 195 after cake - will monitor  Assessment/Plan: Plan for discharge tomorrow, Breastfeeding and Lactation consult.  rouitne PP care Follow FSBS not on meds prepreg, but type 2 - will f/u w Balan   LOS: 2 days   Cathy Daniels 12/24/2018, 7:58 AM

## 2018-12-24 NOTE — Progress Notes (Signed)
MOB was referred for history of depression/anxiety. * Referral screened out by Clinical Social Worker because none of the following criteria appear to apply: ~ History of anxiety/depression during this pregnancy, or of post-partum depression following prior delivery. in 2018 MOB had anxiety regarding work ( passing an exam/situational). No concerns noted in OB record.  ~ Diagnosis of anxiety and/or depression within last 3 years OR * MOB's symptoms currently being treated with medication and/or therapy. Please contact the Clinical Social Worker if needs arise, by MOB request, or if MOB scores greater than 9/yes to question 10 on Edinburgh Postpartum Depression Screen.  Matheus Spiker Boyd-Gilyard, MSW, LCSW Clinical Social Work [phone] 

## 2018-12-24 NOTE — Anesthesia Postprocedure Evaluation (Signed)
Anesthesia Post Note  Patient: Cathy Daniels  Procedure(s) Performed: AN AD HOC LABOR EPIDURAL     Patient location during evaluation: Mother Baby Anesthesia Type: Epidural Level of consciousness: awake and alert Pain management: pain level controlled Vital Signs Assessment: post-procedure vital signs reviewed and stable Respiratory status: spontaneous breathing, nonlabored ventilation and respiratory function stable Cardiovascular status: stable Postop Assessment: no headache, no backache and epidural receding Anesthetic complications: no    Last Vitals:  Vitals:   12/24/18 0000 12/24/18 0420  BP: 117/63 99/68  Pulse: 73 81  Resp: 16 16  Temp: 36.6 C 36.7 C  SpO2:      Last Pain:  Vitals:   12/24/18 0613  TempSrc:   PainSc: 5    Pain Goal:                 Cathy Daniels

## 2018-12-25 ENCOUNTER — Encounter (HOSPITAL_COMMUNITY): Payer: Self-pay | Admitting: *Deleted

## 2018-12-25 LAB — GLUCOSE, CAPILLARY: Glucose-Capillary: 80 mg/dL (ref 70–99)

## 2018-12-25 MED ORDER — IBUPROFEN 600 MG PO TABS: 600.0000 mg | ORAL_TABLET | Freq: Four times a day (QID) | ORAL | 1 refills | Status: DC | PRN

## 2018-12-25 MED ORDER — LEVOTHYROXINE SODIUM 112 MCG PO TABS: 112.0000 ug | ORAL_TABLET | Freq: Every day | ORAL | 1 refills | Status: DC

## 2018-12-25 MED FILL — LEVOTHYROXINE 112 MCG TAB: 112 | 30 days supply | Qty: 30 | Fill #0

## 2018-12-25 MED FILL — IBUPROFEN 600 MG TABLET: 600 | 10 days supply | Qty: 40 | Fill #0

## 2018-12-25 NOTE — Discharge Instructions (Signed)
Call office with any concerns [phone] 

## 2018-12-25 NOTE — Lactation Note (Signed)
This note was copied from a baby's chart. Lactation Consultation Note  Patient Name: Cathy Daniels HKFEX'M Date: 12/25/2018 Reason for consult: Follow-up assessment;Term  P2 mother whose infant is now 19 hours old.  Mother breast fed her first child (now 33 years old) for 3 months and then pumped and bottle fed for an additional 7 months.  Mother's breasts are soft and non tender and nipples are pink.  Mother stated there is only discomfort when the latch is not correct.  Upon oral assessment baby has a short lingual frenulum.  She can extend her tongue but does keep it high to her upper palate.  Suck training performed but baby remained quite sleepy.  Family was just awakening as I arrived.  Since it had been greater than 3 hours since baby fed I offered to assist with latching and mother accepted.  Changed diaper and gently stimulated baby to awaken.  She remained sleepy.  Mother used the manual pump to express a few drops of colostrum which I finger fed back to baby.  Attempted to latch in the football hold on the left breast.  Baby was able to latch but only sucked a few times with stimulation.  Mother did a few breast compressions but still no interest.  Baby placed STS and mother will call as needed for latch assistance when baby awakens and shows cues.  Engorgement prevention/treatment discussed.  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  She will take her manual pump home also.  She knows to use coconut oil for lubrication and a #27 flange size provided in addition to the #24.  She has our OP phone number for questions after discharge.  Mother stated that baby had a couple of good feedings last night.  Father present.  Encouraged her to call prior to discharge if she would like assistance with latching.  Mother verbalized understanding.   Maternal Data Formula Feeding for Exclusion: No Has patient been taught Hand Expression?: Yes Does the patient have breastfeeding experience prior to  this delivery?: Yes  Feeding Feeding Type: Breast Fed  LATCH Score Latch: Repeated attempts needed to sustain latch, nipple held in mouth throughout feeding, stimulation needed to elicit sucking reflex.  Audible Swallowing: None  Type of Nipple: Everted at rest and after stimulation(short shafted)  Comfort (Breast/Nipple): Soft / non-tender  Hold (Positioning): Assistance needed to correctly position infant at breast and maintain latch.  LATCH Score: 6  Interventions Interventions: Breast feeding basics reviewed;Assisted with latch;Skin to skin;Breast massage;Hand express;Pre-pump if needed;Breast compression;Hand pump;Coconut oil;Position options;Support pillows;Adjust position  Lactation Tools Discussed/Used WIC Program: No   Consult Status Consult Status: Complete Date: 12/25/18 Follow-up type: Call as needed    Cathy Daniels 12/25/2018, 8:24 AM

## 2018-12-25 NOTE — Discharge Summary (Signed)
OB Discharge Summary     Patient Name: Cathy CharonBableen Daniels DOB: December 29, 1985 MRN: [id number]  Date of admission: 12/22/2018 Delivering MD: Huel CoteICHARDSON, KATHY   Date of discharge: 12/25/2018  Admitting diagnosis: 39wks evaluation Intrauterine pregnancy: 6584w3d     Secondary diagnosis:  Active Problems:   Diabetes mellitus affecting pregnancy in third trimester   NSVD (normal spontaneous vaginal delivery)  Additional problems: PIH, hypothyroidism     Discharge diagnosis: Term Pregnancy Delivered, Gestational Hypertension and Type 2 DM                                                                                                Post partum procedures:none  Augmentation: Pitocin and Cytotec  Complications: None  Hospital course:  Induction of Labor With Vaginal Delivery   33 y.o. yo G2P2002 at 5784w3d was admitted to the hospital 12/22/2018 for induction of labor.  Indication for induction: Gestational hypertension and TYPE 2 DM.  Patient had an uncomplicated labor course as follows: Membrane Rupture Time/Date: 9:54 AM ,12/23/2018   Intrapartum Procedures: Episiotomy: None [1]                                         Lacerations:  2nd degree [3]  Patient had delivery of a Viable infant.  Information for the patient's newborn:  Jeannie DoneKaur, Girl Daliah [[id number]][[id number]]  Delivery Method: Vaginal, Spontaneous(Filed from Delivery Summary)   12/23/2018  Details of delivery can be found in separate delivery note.  Patient had a routine postpartum course. Patient is discharged home 12/25/18.  Physical exam  Vitals:   12/24/18 0948 12/24/18 1433 12/24/18 2224 12/25/18 0548  BP: 111/60 107/65 125/65 102/61  Pulse: 84 79 70 71  Resp: 20 18 18 18   Temp: 97.6 F (36.4 C) 98.2 F (36.8 C) 98.2 F (36.8 C) (!) 97.5 F (36.4 C)  TempSrc: Oral Oral Oral Oral  SpO2: 100%  100% 100%  Weight:      Height:       General: alert, cooperative and no distress Lochia: appropriate Uterine Fundus: firm Incision:  N/A DVT Evaluation: No evidence of DVT seen on physical exam. Labs: Lab Results  Component Value Date   WBC 16.4 (H) 12/24/2018   HGB 9.8 (L) 12/24/2018   HCT 31.2 (L) 12/24/2018   MCV 80.2 12/24/2018   PLT 257 12/24/2018   CMP Latest Ref Rng & Units 12/22/2018  Glucose 70 - 99 mg/dL 82  BUN 6 - 20 mg/dL 7  Creatinine 7.820.44 - 9.561.00 mg/dL 2.130.52  Sodium 086135 - 578145 mmol/L 133(L)  Potassium 3.5 - 5.1 mmol/L 3.8  Chloride 98 - 111 mmol/L 106  CO2 22 - 32 mmol/L 20(L)  Calcium 8.9 - 10.3 mg/dL 4.6(N8.7(L)  Total Protein 6.5 - 8.1 g/dL 6.9  Total Bilirubin 0.3 - 1.2 mg/dL 0.5  Alkaline Phos 38 - 126 U/L 96  AST 15 - 41 U/L 15  ALT 0 - 44 U/L 13    Discharge instruction: per After Visit Summary and "Baby  and Me Booklet".  After visit meds:    Diet: low salt diet  Activity: Advance as tolerated. Pelvic rest for 6 weeks.   Outpatient follow up:1 week for BP check and 6 weeks for postpartum visit Follow up Appt:No future appointments. Follow up Visit:No follow-ups on file.  Postpartum contraception: Not Discussed  Newborn Data: Live born female  Birth Weight: 7 lb 1.6 oz (3220 g) APGAR: 9, 9  Newborn Delivery   Birth date/time:  12/23/2018 16:51:00 Delivery type:  Vaginal, Spontaneous     Baby Feeding: Breast Disposition:home with mother   12/25/2018 Cathrine Muster, DO

## 2018-12-25 NOTE — Progress Notes (Signed)
Patient ID: Cathy Daniels, female   DOB: November 24, 1986, 33 y.o.   MRN: [id number] Pt doing well with no complaint except fatigue. She denies HA/blurry vision/CP/SOB. She is ambulating and tolerating voids. She is bonding well with baby. Backpain controlled with meds. Lochia mild. Breastfeeding. Ready for discharge to home today VSS- 102-125/61-65 ABD - FF and 3cm below umbuilicus EXT - mild edema only, no homans  Fasting fsbs - 80  A/P: PPD#2 s/p svd - stable         Discharge to home today         BP check in office in a week; f/u with Dr Talmage Nap regarding diabetes and hypothyroidism on 1/6

## 2018-12-28 ENCOUNTER — Other Ambulatory Visit: Payer: Self-pay | Admitting: *Deleted

## 2018-12-28 ENCOUNTER — Inpatient Hospital Stay (HOSPITAL_COMMUNITY): Admission: RE | Admit: 2018-12-28 | Payer: No Typology Code available for payment source | Source: Ambulatory Visit

## 2018-12-28 NOTE — Patient Outreach (Addendum)
[facility]) Care Management  12/28/2018  Cathy Daniels [birth year] [id number]   Transition of care call  Subjective: Initial successful telephone call to patient's preferred number in order to complete transition of care assessment;  2 HIPAA identifiers verified. Explained purpose of call and completed transition of care assessment.  Cathy Daniels states the baby weighed 7 lbs  1.6 oz. She says she has been in touch with Dr Talmage Nap, her endocrinologist, and she was instructed to stop all of her diabetes medicine and that she does not need medication as long as her blood sugars either premeal or post meal  remain in 80-180 range.  Objective:  Cathy Daniels was hospitalized at [facility] 12/22/18-12/25/18 for the vaginal birth of her baby girl on 12/23/18.   Assessment:  See transition of care flowsheet for assessment details. .   Plan:  Will plan to call Cathy Daniels again on 1/10 to assess status of her and the baby.  Cathy Richard RN,CCM,CDE [facility] Care Management Coordinator Office Phone [phone] Office Fax [phone]

## 2019-01-01 ENCOUNTER — Other Ambulatory Visit: Payer: Self-pay | Admitting: *Deleted

## 2019-01-01 ENCOUNTER — Ambulatory Visit: Payer: Self-pay | Admitting: *Deleted

## 2019-01-01 NOTE — Patient Outreach (Signed)
[facility]) Care Management  01/01/2019  Cathy Daniels 1986-11-23 [id number]  Transition of care call  Subjective: Second telephone call to patient's preferred number to assess recovery from childbirth and status of Type II  diabetes self management;  no answer, left HIPAA compliant message requesting return call  Objective:  Cathy Daniels was hospitalized at [facility] 12/22/18-12/25/18 for the vaginal birth of her baby girl on 12/23/18.   Assessment:  See transition of care flowsheet for assessment details. .   Plan:  If no return call, will plan to call Cathy Daniels again in next 3-4 business to assess status of her and the baby.  [name] RN,CCM,CDE [facility] Care Management Coordinator Office Phone [phone] Office Fax [phone]

## 2019-01-07 ENCOUNTER — Ambulatory Visit: Payer: Self-pay | Admitting: *Deleted

## 2019-01-07 ENCOUNTER — Other Ambulatory Visit: Payer: Self-pay | Admitting: *Deleted

## 2019-01-07 NOTE — Patient Outreach (Signed)
[facility]) Care Management  01/07/2019  Cathy Daniels 09/18/1986 [id number]   Transition of care call  Subjective: Third telephone call to patient's preferred number successful to assess recovery from childbirth and status of gestational diabetes self management. Kaeden remains off all diabetes medications and her blood glucose checks are normal. She has her postpartum check on 02/02/19. She states she will notify this RNCM if she needs to remain in a diabetes management program.    Objective: Bableenwas hospitalized at [facility] 12/22/18-12/25/18 for the vaginal birth of her baby girl on 12/23/18 the baby weighed 7 lbs 1.6 ozs.   Assessment:  See transition of care flowsheet for assessment details. .  Plan: Will contact patient after her post partum office visit to determine status of gestational diabetes.   [name] RN,CCM,CDE [facility] Care Management Coordinator Office Phone [phone] Office Fax [phone]

## 2019-01-11 ENCOUNTER — Other Ambulatory Visit: Payer: Self-pay | Admitting: Osteopathic Medicine

## 2019-01-12 ENCOUNTER — Other Ambulatory Visit: Payer: Self-pay | Admitting: Osteopathic Medicine

## 2019-01-12 MED FILL — NAPROXEN 500 MG TABLET: 500 | 30 days supply | Qty: 60 | Fill #0

## 2019-02-01 MED FILL — LEVOTHYROXINE 112 MCG TAB: 112 | 30 days supply | Qty: 30 | Fill #1

## 2019-02-01 MED FILL — IBUPROFEN 600 MG TABLET: 600 | 10 days supply | Qty: 40 | Fill #1

## 2019-02-08 ENCOUNTER — Other Ambulatory Visit: Payer: Self-pay | Admitting: *Deleted

## 2019-02-08 NOTE — Patient Outreach (Signed)
[facility]) Care Management  02/08/2019  Cathy Daniels Dec 08, 1986 [id number]   Cathy Daniels is a member of the [facility] Care Management Gestational Diabetes Program. She delivered a healthy baby girl on 12/23/18.  This RNCM sent a secure e-mail to Baleen on 02/05/19 asking about the status of her gestational diabetes and the health of her and her baby, [name] replied via e-mail on 02/07/19 at 6:36 am with the following email:  [name],    We are doing good. Hope you are as well. I went for my six week follow up. Everything looks good. Systolic bp is fine but diastolic was 90. She told me to keep an eye on it. Sugars at home all have been fine. She said to have my A1C checked in May. (My ob did) so I can keep u in the loop if u like in May. Otherwise, I am doing good.    Plan: Have requested [name] to update this RNCM on the results of her Hgb A1C when it is checked in May. She remains an active participant in [facility] Type 2 digital assistant platform.  [name] RN,CCM,CDE [facility] Care Management Coordinator Office Phone [phone] Office Fax [phone]

## 2019-03-16 MED FILL — LEVOTHYROXINE 112 MCG TAB: 112 | 90 days supply | Qty: 90 | Fill #1

## 2019-05-21 ENCOUNTER — Other Ambulatory Visit: Payer: Self-pay | Admitting: *Deleted

## 2019-05-21 NOTE — Patient Outreach (Signed)
[facility]) Care Management  05/21/2019  Cathy Daniels [birth year] [id number]  [name] is a member of the [facility] Care Management Gestational Diabetes Program. She delivered a healthy baby girl on 12/23/18. [name] had no post partum complications. In response to secure e-mail sent to [name] earlier today, she responded with e-mail stating she had her annual wellness exam on 05/19/19 and her blood work was normal with her fasting glucose = 84 and her Hgb A1C= 5.4%. She did express the desire to lose weight.  Plan: [employer] via secure e-mail on her excellent lab work.  Will disenroll [name] from the [facility] Care Management Gestational Diabetes Program. Securely e-mailed her information on how to qualify for the [place]  Healthy Lifestyle Premium in 2021 and the Weight Management programs offered by [facility].  [name] RN,CCM,CDE [facility] Care Management Coordinator Office Phone [phone] Office Fax [phone]

## 2019-06-16 MED FILL — LEVOTHYROXINE 112 MCG TAB: 112 | 90 days supply | Qty: 90 | Fill #2

## 2019-06-16 MED FILL — IBUPROFEN 600 MG TABLET: 600 | 30 days supply | Qty: 90 | Fill #0

## 2019-09-13 ENCOUNTER — Telehealth (HOSPITAL_COMMUNITY): Payer: Self-pay | Admitting: Lactation Services

## 2019-09-13 NOTE — Telephone Encounter (Signed)
Pt reports that she has a crack at the base of her R nipple  (on the R side). She has put coconut oil & vaseline on the crack with good results, but the crack starts to bleed when she pumps (she works 12-hr shifts at [employer]). Mom has been using size 24 flanges since her infant was born in Jan of this year. Mom affirms that the base/sides of her nipples rub against the inside of the flange when pumping.  I suspect that Mom needs to go to a larger sized flange. I recommended that she try the size 27 flange with pumping (and she can pre-lubricate it with a bit of coconut oil beforehand). Mom said she will try the size 27's. Mom will call us in a few days if no improvement.  Elinor Dodge, RN, IBCLC

## 2019-09-16 MED FILL — LEVOTHYROXINE 112 MCG TAB: 112 | 90 days supply | Qty: 90 | Fill #3

## 2019-12-06 MED FILL — LEVOTHYROXINE 112 MCG TAB: 112 | 90 days supply | Qty: 90 | Fill #0

## 2019-12-06 MED FILL — IBUPROFEN 600 MG TABLET: 600 | 30 days supply | Qty: 90 | Fill #0

## 2020-02-25 MED FILL — DICLOFENAC SOD EC 50 MG TAB: 50 | 30 days supply | Qty: 60 | Fill #0

## 2020-03-14 MED FILL — LEVOTHYROXINE 112 MCG TAB: 112 | 90 days supply | Qty: 90 | Fill #1

## 2020-06-14 MED FILL — LEVOTHYROXINE 112 MCG TAB: 112 | 90 days supply | Qty: 90 | Fill #0

## 2020-06-22 MED FILL — ANTIFUNGAL CLOTRIMAZOLE 1 %: 1 | 28 days supply | Qty: 28 | Fill #0

## 2020-06-29 MED FILL — FLUCONAZOLE 150 MG TABS: 150 | 4 days supply | Qty: 2 | Fill #0

## 2020-07-27 MED FILL — TRIAMCINOLONE ACETONIDE 0.1: 0.1 | 30 days supply | Qty: 80 | Fill #0

## 2020-09-07 ENCOUNTER — Other Ambulatory Visit (HOSPITAL_COMMUNITY): Payer: Self-pay | Admitting: Nurse Practitioner

## 2020-09-07 DIAGNOSIS — U071 COVID-19: Secondary | ICD-10-CM

## 2020-09-07 MED ORDER — PULMICORT FLEXHALER 90 MCG/ACT IN AEPB: 1.0000 | INHALATION_SPRAY | Freq: Two times a day (BID) | RESPIRATORY_TRACT | 0 refills | Status: DC

## 2020-09-07 NOTE — Progress Notes (Signed)
I connected by phone with Modesto Charon on 09/07/2020 at 1:21 PM to discuss the potential use of an new treatment for mild to moderate COVID-19 viral infection in non-hospitalized patients.  This patient is a 34 y.o. female that meets the FDA criteria for Emergency Use Authorization of casirivimab\imdevimab.  Has a (+) direct SARS-CoV-2 viral test result  Has mild or moderate COVID-19   Is ? 34 years of age and weighs ? 40 kg  Is NOT hospitalized due to COVID-19  Is NOT requiring oxygen therapy or requiring an increase in baseline oxygen flow rate due to COVID-19  Is within 10 days of symptom onset  Has at least one of the high risk factor(s) for progression to severe COVID-19 and/or hospitalization as defined in EUA.  Specific high risk criteria : BMI > 25, Chronic Lung Disease and Other high risk medical condition per CDC:  SVI  Sx onset 9/13. vaccinated  I have spoken and communicated the following to the patient or parent/caregiver:  1. FDA has authorized the emergency use of casirivimab\imdevimab for the treatment of mild to moderate COVID-19 in adults and pediatric patients with positive results of direct SARS-CoV-2 viral testing who are 41 years of age and older weighing at least 40 kg, and who are at high risk for progressing to severe COVID-19 and/or hospitalization.  2. The significant known and potential risks and benefits of casirivimab\imdevimab, and the extent to which such potential risks and benefits are unknown.  3. Information on available alternative treatments and the risks and benefits of those alternatives, including clinical trials.  4. Patients treated with casirivimab\imdevimab should continue to self-isolate and use infection control measures (e.g., wear mask, isolate, social distance, avoid sharing personal items, clean and disinfect "high touch" surfaces, and frequent handwashing) according to CDC guidelines.   5. The patient or parent/caregiver has the  option to accept or refuse casirivimab\imdevimab .  After reviewing this information with the patient, The patient agreed to proceed with receiving casirivimab\imdevimab infusion and will be provided a copy of the Fact sheet prior to receiving the infusion.Consuello Masse, DNP, AGNP-C [phone] (Infusion Center Hotline)

## 2020-09-08 ENCOUNTER — Ambulatory Visit (HOSPITAL_COMMUNITY)
Admission: RE | Admit: 2020-09-08 | Discharge: 2020-09-08 | Disposition: A | Discharge status: Home / Self Care | Payer: No Typology Code available for payment source | Source: Ambulatory Visit | Attending: Pulmonary Disease | Admitting: Pulmonary Disease

## 2020-09-08 ENCOUNTER — Other Ambulatory Visit (HOSPITAL_COMMUNITY): Payer: Self-pay

## 2020-09-08 DIAGNOSIS — U071 COVID-19: Secondary | ICD-10-CM | POA: Insufficient documentation

## 2020-09-08 MED ORDER — FAMOTIDINE IN NACL 20-0.9 MG/50ML-% IV SOLN: 20.0000 mg | Freq: Once | INTRAVENOUS | Status: DC | PRN

## 2020-09-08 MED ORDER — ALBUTEROL SULFATE HFA 108 (90 BASE) MCG/ACT IN AERS: 2.0000 | INHALATION_SPRAY | Freq: Once | RESPIRATORY_TRACT | Status: DC | PRN

## 2020-09-08 MED ORDER — EPINEPHRINE 0.3 MG/0.3ML IJ SOAJ: 0.3000 mg | Freq: Once | INTRAMUSCULAR | Status: DC | PRN

## 2020-09-08 MED ORDER — SODIUM CHLORIDE 0.9 % IV SOLN: INTRAVENOUS | Status: DC | PRN

## 2020-09-08 MED ORDER — METHYLPREDNISOLONE SODIUM SUCC 125 MG IJ SOLR: 125.0000 mg | Freq: Once | INTRAMUSCULAR | Status: DC | PRN

## 2020-09-08 MED ORDER — SODIUM CHLORIDE 0.9 % IV SOLN
1200.0000 mg | Freq: Once | INTRAVENOUS | Status: AC
  Administered 2020-09-08: 1200 mg via INTRAVENOUS

## 2020-09-08 MED ORDER — DIPHENHYDRAMINE HCL 50 MG/ML IJ SOLN: 50.0000 mg | Freq: Once | INTRAMUSCULAR | Status: DC | PRN

## 2020-09-08 NOTE — Discharge Instructions (Signed)

## 2020-09-08 NOTE — Progress Notes (Signed)
  Diagnosis: COVID-19  Physician: Patrick Wright, MD  Procedure: [facility] Infusion [facility] Med: casirivimab\imdevimab infusion - Provided patient with casirivimab\imdevimab fact sheet for patients, parents and caregivers prior to infusion.  Complications: No immediate complications noted.  Discharge: Discharged home   Trevious Rampey N Leronda Lewers 09/08/2020  

## 2020-09-13 MED FILL — LEVOTHYROXINE 112 MCG TAB: 112 | 90 days supply | Qty: 90 | Fill #1

## 2020-09-14 MED FILL — predniSONE 10 MG (21) TBPK: 10 | 6 days supply | Qty: 21 | Fill #0

## 2020-09-14 MED FILL — FLOVENT HFA 110 MCG INHALER: 110 | 60 days supply | Qty: 12 | Fill #0

## 2020-12-11 ENCOUNTER — Other Ambulatory Visit: Payer: Self-pay | Admitting: Family Medicine

## 2020-12-11 DIAGNOSIS — R0789 Other chest pain: Secondary | ICD-10-CM

## 2020-12-12 ENCOUNTER — Other Ambulatory Visit (HOSPITAL_COMMUNITY): Payer: Self-pay | Admitting: Family Medicine

## 2020-12-12 MED FILL — LEVOTHYROXINE 112 MCG TAB: 112 | 90 days supply | Qty: 90 | Fill #0

## 2021-02-22 MED FILL — LEVOTHYROXINE 112 MCG TAB: 112 | 90 days supply | Qty: 90 | Fill #1

## 2021-03-13 ENCOUNTER — Other Ambulatory Visit (HOSPITAL_BASED_OUTPATIENT_CLINIC_OR_DEPARTMENT_OTHER): Payer: Self-pay

## 2021-03-16 ENCOUNTER — Other Ambulatory Visit (HOSPITAL_BASED_OUTPATIENT_CLINIC_OR_DEPARTMENT_OTHER): Payer: Self-pay

## 2021-05-31 ENCOUNTER — Other Ambulatory Visit (HOSPITAL_COMMUNITY): Payer: Self-pay

## 2021-05-31 MED ORDER — IBUPROFEN 600 MG PO TABS
ORAL_TABLET | ORAL | 3 refills | Status: DC
  Filled 2021-05-31: qty 90, 30d supply, fill #0

## 2021-05-31 MED ORDER — NAPROXEN 500 MG PO TABS
ORAL_TABLET | ORAL | 3 refills | Status: DC
  Filled 2021-05-31: qty 90, 45d supply, fill #0

## 2021-06-01 ENCOUNTER — Other Ambulatory Visit (HOSPITAL_COMMUNITY): Payer: Self-pay

## 2021-06-01 MED ORDER — LEVOTHYROXINE SODIUM 112 MCG PO TABS
112.0000 ug | ORAL_TABLET | Freq: Every morning | ORAL | 3 refills | Status: DC
  Filled 2021-06-01: qty 90, 90d supply, fill #0
  Filled 2021-09-03: qty 90, 90d supply, fill #1
  Filled 2021-09-17 – 2021-12-11 (×2): qty 90, 90d supply, fill #2
  Filled 2022-03-11: qty 90, 90d supply, fill #3

## 2021-06-04 ENCOUNTER — Other Ambulatory Visit (HOSPITAL_COMMUNITY): Payer: Self-pay

## 2021-06-21 ENCOUNTER — Other Ambulatory Visit: Payer: Self-pay | Admitting: Family Medicine

## 2021-06-21 DIAGNOSIS — E038 Other specified hypothyroidism: Secondary | ICD-10-CM

## 2021-06-21 DIAGNOSIS — E049 Nontoxic goiter, unspecified: Secondary | ICD-10-CM

## 2021-07-05 ENCOUNTER — Other Ambulatory Visit: Payer: Self-pay

## 2021-07-05 ENCOUNTER — Ambulatory Visit (INDEPENDENT_AMBULATORY_CARE_PROVIDER_SITE_OTHER): Payer: No Typology Code available for payment source

## 2021-07-05 DIAGNOSIS — E038 Other specified hypothyroidism: Secondary | ICD-10-CM | POA: Diagnosis not present

## 2021-07-05 DIAGNOSIS — E049 Nontoxic goiter, unspecified: Secondary | ICD-10-CM

## 2021-07-12 ENCOUNTER — Other Ambulatory Visit (HOSPITAL_COMMUNITY): Payer: Self-pay

## 2021-08-08 ENCOUNTER — Other Ambulatory Visit (HOSPITAL_BASED_OUTPATIENT_CLINIC_OR_DEPARTMENT_OTHER): Payer: Self-pay

## 2021-08-08 MED ORDER — PREDNISONE 10 MG PO TABS
ORAL_TABLET | ORAL | 0 refills | Status: DC
  Filled 2021-08-08: qty 21, 6d supply, fill #0

## 2021-08-28 ENCOUNTER — Other Ambulatory Visit (HOSPITAL_BASED_OUTPATIENT_CLINIC_OR_DEPARTMENT_OTHER): Payer: Self-pay

## 2021-08-28 MED ORDER — CARESTART COVID-19 HOME TEST VI KIT
PACK | 0 refills | Status: DC
  Filled 2021-08-28: qty 4, 4d supply, fill #0

## 2021-09-03 ENCOUNTER — Other Ambulatory Visit (HOSPITAL_BASED_OUTPATIENT_CLINIC_OR_DEPARTMENT_OTHER): Payer: Self-pay

## 2021-09-17 ENCOUNTER — Other Ambulatory Visit (HOSPITAL_BASED_OUTPATIENT_CLINIC_OR_DEPARTMENT_OTHER): Payer: Self-pay

## 2021-09-18 ENCOUNTER — Other Ambulatory Visit (HOSPITAL_BASED_OUTPATIENT_CLINIC_OR_DEPARTMENT_OTHER): Payer: Self-pay

## 2021-10-09 ENCOUNTER — Other Ambulatory Visit (HOSPITAL_BASED_OUTPATIENT_CLINIC_OR_DEPARTMENT_OTHER): Payer: Self-pay | Admitting: Nurse Practitioner

## 2021-10-09 ENCOUNTER — Other Ambulatory Visit (HOSPITAL_BASED_OUTPATIENT_CLINIC_OR_DEPARTMENT_OTHER): Payer: Self-pay

## 2021-10-09 DIAGNOSIS — L301 Dyshidrosis [pompholyx]: Secondary | ICD-10-CM

## 2021-10-09 MED ORDER — MOMETASONE FUROATE 0.1 % EX CREA
TOPICAL_CREAM | CUTANEOUS | 3 refills | Status: DC
  Filled 2021-10-09: qty 45, 14d supply, fill #0

## 2021-11-14 ENCOUNTER — Other Ambulatory Visit (HOSPITAL_BASED_OUTPATIENT_CLINIC_OR_DEPARTMENT_OTHER): Payer: Self-pay

## 2021-11-14 MED ORDER — BENZONATATE 100 MG PO CAPS
ORAL_CAPSULE | ORAL | 0 refills | Status: DC
  Filled 2021-11-14: qty 21, 7d supply, fill #0

## 2021-11-14 MED ORDER — PROMETHAZINE-DM 6.25-15 MG/5ML PO SYRP
ORAL_SOLUTION | ORAL | 0 refills | Status: DC
  Filled 2021-11-14: qty 90, 18d supply, fill #0

## 2021-11-14 MED ORDER — OSELTAMIVIR PHOSPHATE 75 MG PO CAPS
ORAL_CAPSULE | ORAL | 0 refills | Status: DC
  Filled 2021-11-14: qty 10, 5d supply, fill #0

## 2021-12-11 ENCOUNTER — Other Ambulatory Visit (HOSPITAL_COMMUNITY): Payer: Self-pay

## 2021-12-13 ENCOUNTER — Other Ambulatory Visit (HOSPITAL_COMMUNITY): Payer: Self-pay

## 2022-03-11 ENCOUNTER — Other Ambulatory Visit (HOSPITAL_COMMUNITY): Payer: Self-pay

## 2022-05-07 ENCOUNTER — Other Ambulatory Visit (HOSPITAL_COMMUNITY): Payer: Self-pay

## 2022-05-07 MED ORDER — AMOXICILLIN-POT CLAVULANATE 875-125 MG PO TABS
ORAL_TABLET | ORAL | 0 refills | Status: DC
  Filled 2022-05-07: qty 14, 7d supply, fill #0

## 2022-05-31 ENCOUNTER — Encounter: Payer: Self-pay | Admitting: Neurology

## 2022-06-03 ENCOUNTER — Encounter: Payer: Self-pay | Admitting: Physician Assistant

## 2022-06-03 ENCOUNTER — Ambulatory Visit (INDEPENDENT_AMBULATORY_CARE_PROVIDER_SITE_OTHER): Payer: No Typology Code available for payment source | Admitting: Physician Assistant

## 2022-06-03 VITALS — BP 148/83 | HR 80 | Ht 63.5 in | Wt 235.0 lb

## 2022-06-03 DIAGNOSIS — Z13220 Encounter for screening for lipoid disorders: Secondary | ICD-10-CM | POA: Diagnosis not present

## 2022-06-03 DIAGNOSIS — Z131 Encounter for screening for diabetes mellitus: Secondary | ICD-10-CM

## 2022-06-03 DIAGNOSIS — Z1159 Encounter for screening for other viral diseases: Secondary | ICD-10-CM

## 2022-06-03 DIAGNOSIS — Z6841 Body Mass Index (BMI) 40.0 and over, adult: Secondary | ICD-10-CM

## 2022-06-03 DIAGNOSIS — E6609 Other obesity due to excess calories: Secondary | ICD-10-CM | POA: Insufficient documentation

## 2022-06-03 DIAGNOSIS — R030 Elevated blood-pressure reading, without diagnosis of hypertension: Secondary | ICD-10-CM

## 2022-06-03 DIAGNOSIS — E039 Hypothyroidism, unspecified: Secondary | ICD-10-CM

## 2022-06-03 DIAGNOSIS — R7303 Prediabetes: Secondary | ICD-10-CM

## 2022-06-03 DIAGNOSIS — R011 Cardiac murmur, unspecified: Secondary | ICD-10-CM | POA: Insufficient documentation

## 2022-06-03 DIAGNOSIS — D508 Other iron deficiency anemias: Secondary | ICD-10-CM

## 2022-06-03 DIAGNOSIS — Z0000 Encounter for general adult medical examination without abnormal findings: Secondary | ICD-10-CM

## 2022-06-03 NOTE — Progress Notes (Signed)
New Patient Office Visit  Subjective    Patient ID: Cathy Daniels, female    DOB: 1986-12-02  Age: 36 y.o. MRN: [id number]  CC:  Chief Complaint  Patient presents with   Establish Care    HPI Cathy Daniels presents to establish care. She has asthma, allergies, hypothyroidism, and gestational diabetes with last a1c 6.1. She has been gaining weight over the past few months and very frustrated by this.    Outpatient Encounter Medications as of 06/03/2022  Medication Sig   Budesonide (PULMICORT FLEXHALER) 90 MCG/ACT inhaler Inhale 1 puff into the lungs 2 (two) times daily.   ibuprofen (ADVIL,MOTRIN) 600 MG tablet Take 1 tablet (600 mg total) by mouth every 6 (six) hours as needed for cramping.   levothyroxine (SYNTHROID) 112 MCG tablet TAKE 1 TABLET BY MOUTH EVERY MORNING ON AN EMPTY STOMACH   [DISCONTINUED] amoxicillin-clavulanate (AUGMENTIN) 875-125 MG tablet Take 1 tablet by mouth twice daily for 7 days   [DISCONTINUED] benzonatate (TESSALON PERLES) 100 MG capsule Take 1 capsule by mouth 3 times a day as needed   [DISCONTINUED] COVID-19 At Home Antigen Test (CARESTART COVID-19 HOME TEST) KIT Use as directed   [DISCONTINUED] ibuprofen (ADVIL) 600 MG tablet Take 1 tablet by mouth 3 times daily as needed.  Take with food or milk for menstrual cramps   [DISCONTINUED] levothyroxine (SYNTHROID) 112 MCG tablet Take 1 tablet (112 mcg total) by mouth daily before breakfast.   [DISCONTINUED] levothyroxine (SYNTHROID, LEVOTHROID) 112 MCG tablet Take 112 mcg by mouth daily before breakfast.   [DISCONTINUED] mometasone (ELOCON) 0.1 % cream Apply to rash twice a day   [DISCONTINUED] naproxen (NAPROSYN) 500 MG tablet Take 1 tablet by mouth with food or milk as needed every 12 hrs for menstrual cramps   [DISCONTINUED] oseltamivir (TAMIFLU) 75 MG capsule Take 1 capsule by mouth twice a day for 5 days   [DISCONTINUED] predniSONE (DELTASONE) 10 MG tablet Take 6 tablets by mouth on day one, then 5 tablets on  day two, then 4 tablets on day three, then 3 tablets on day four, then 2 tablets on day five, and 1 tablet on day six.   [DISCONTINUED] Prenatal MV & Min w/FA-DHA (PRENATAL ADULT GUMMY/DHA/FA) 0.4-25 MG CHEW Chew 1 tablet by mouth.   [DISCONTINUED] promethazine-dextromethorphan (PROMETHAZINE-DM) 6.25-15 MG/5ML syrup Take 5 mL by mouth at bedtime as needed for 7 days   No facility-administered encounter medications on file as of 06/03/2022.    Past Medical History:  Diagnosis Date   Abnormal pap 2007, 2008   colpo x 3   Abnormal Pap smear    colpo- ok since   Anemia    Asthma    smoke induced, no inhaler used in  over a year   Genital warts    GERD (gastroesophageal reflux disease)    Gestational diabetes    Hypothyroid    Infection 2017   UTI   Pregnancy induced hypertension    Seasonal allergies    Vaginal Pap smear, abnormal    had colpo- normal since    Past Surgical History:  Procedure Laterality Date   COLPOSCOPY  3/07, 8/07, 1/08   CIN II, benign, CIN I   NO PAST SURGERIES     WISDOM TOOTH EXTRACTION      Family History  Problem Relation Age of Onset   Diabetes Father    Hypertension Mother    Diabetes Other        PU x 5, PA x 1  Glaucoma Maternal Grandmother    Hypertension Maternal Grandfather    Hearing loss Neg Hx     Social History   Socioeconomic History   Marital status: Married    Spouse name: Not on file   Number of children: 1   Years of education: Not on file   Highest education level: Not on file  Occupational History   Occupation: RN    Comment: [employer]  Tobacco Use   Smoking status: Never   Smokeless tobacco: Never  Vaping Use   Vaping Use: Never used  Substance and Sexual Activity   Alcohol use: Yes    Comment: sometimes   Drug use: No   Sexual activity: Yes    Partners: Male    Birth control/protection: Condom  Other Topics Concern   Not on file  Social History Narrative   Not on file   Social Determinants of Health    Financial Resource Strain: Not on file  Food Insecurity: Not on file  Transportation Needs: Not on file  Physical Activity: Not on file  Stress: Not on file  Social Connections: Not on file  Intimate Partner Violence: Not on file    Review of Systems  All other systems reviewed and are negative.       Objective    BP (!) 148/83   Pulse 80   Ht 5' 3.5" (1.613 m)   Wt 235 lb (106.6 kg)   SpO2 100%   Breastfeeding No   BMI 40.98 kg/m   Physical Exam Vitals reviewed.  Constitutional:      Appearance: Normal appearance. She is obese.  HENT:     Head: Normocephalic.  Neck:     Vascular: No carotid bruit.  Cardiovascular:     Rate and Rhythm: Normal rate and regular rhythm.     Pulses: Normal pulses.     Heart sounds: Murmur heard.     Comments: Systolic Murmur 2/6 over left intercostal space Pulmonary:     Effort: Pulmonary effort is normal.     Breath sounds: Normal breath sounds.  Musculoskeletal:     Right lower leg: No edema.     Left lower leg: No edema.  Lymphadenopathy:     Cervical: No cervical adenopathy.  Neurological:     General: No focal deficit present.     Mental Status: She is alert and oriented to person, place, and time.  Psychiatric:        Mood and Affect: Mood normal.   .    06/03/2022   10:41 AM 01/28/2017    9:26 AM  Depression screen PHQ 2/9  Decreased Interest 0 0  Down, Depressed, Hopeless 0 0  PHQ - 2 Score 0 0  Altered sleeping 0   Tired, decreased energy 0   Change in appetite 1   Feeling bad or failure about yourself  0   Trouble concentrating 0   Moving slowly or fidgety/restless 0   Suicidal thoughts 0   PHQ-9 Score 1   Difficult doing work/chores Not difficult at all    .Cathy Daniels    06/03/2022   10:41 AM  GAD 7 : Generalized Anxiety Score  Nervous, Anxious, on Edge 1  Control/stop worrying 1  Worry too much - different things 1  Trouble relaxing 0  Restless 0  Easily annoyed or irritable 1  Afraid - awful might  happen 0  Total GAD 7 Score 4  Anxiety Difficulty Not difficult at all  Assessment & Plan:  Cathy KitchenMarland KitchenJunko was seen today for establish care.  Diagnoses and all orders for this visit:  Routine physical examination -     Lipid Panel w/reflex Direct LDL -     COMPLETE METABOLIC PANEL WITH GFR -     TSH -     CBC w/Diff/Platelet -     Fe+TIBC+Fer -     B12 and Folate Panel -     VITAMIN D 25 Hydroxy (Vit-D Deficiency, Fractures) -     Hepatitis C Antibody  Screening for lipid disorders -     Lipid Panel w/reflex Direct LDL  Screening for diabetes mellitus -     COMPLETE METABOLIC PANEL WITH GFR  Hypothyroidism, unspecified type -     TSH  Iron deficiency anemia secondary to inadequate dietary iron intake -     Fe+TIBC+Fer  Encounter for hepatitis C screening test for low risk patient  Pre-diabetes  Elevated blood pressure reading  Newly recognized heart murmur  Class 3 severe obesity due to excess calories without serious comorbidity with body mass index (BMI) of 40.0 to 44.9 in adult ([place])   .Cathy Daniels Discussed 150 minutes of exercise a week.  Encouraged vitamin D 1000 units and Calcium $RemoveBef'1300mg'cDUtRgDTDs$  or 4 servings of dairy a day.  PHQ/GAD no concerns. Fasting labs ordered today and will follow up on glucose, anemia, hypothyroidism. Pap UTD.  BP elevated with new murmur. Asymptomatic. 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 Murmur could be heard due to BP increase.  Cathy Daniels.Discussed low carb diet with 1500 calories and 80g of protein.  Exercising at least 150 minutes a week.  My Fitness Pal could be a Microbiologist.  Discussed wegovy Patient will consider  Asthma/Allergies controlled  Will send refills as needed  Return in about 3 months (around 09/03/2022) for weight.   Iran Planas, [name]

## 2022-06-03 NOTE — Patient Instructions (Signed)

## 2022-06-04 ENCOUNTER — Telehealth: Payer: Self-pay

## 2022-06-04 ENCOUNTER — Other Ambulatory Visit (HOSPITAL_COMMUNITY): Payer: Self-pay

## 2022-06-04 ENCOUNTER — Other Ambulatory Visit: Payer: Self-pay | Admitting: Physician Assistant

## 2022-06-04 DIAGNOSIS — E538 Deficiency of other specified B group vitamins: Secondary | ICD-10-CM | POA: Insufficient documentation

## 2022-06-04 LAB — CBC WITH DIFFERENTIAL/PLATELET
MCHC: 29.2 g/dL — ABNORMAL LOW (ref 32.0–36.0)
Platelets: 481 10*3/uL — ABNORMAL HIGH (ref 140–400)
RDW: 15.8 % — ABNORMAL HIGH (ref 11.0–15.0)

## 2022-06-04 LAB — COMPLETE METABOLIC PANEL WITH GFR
Glucose, Bld: 95 mg/dL (ref 65–99)
Total Protein: 7.2 g/dL (ref 6.1–8.1)
eGFR: 116 mL/min/{1.73_m2} (ref >=60)

## 2022-06-04 MED ORDER — WEGOVY 0.25 MG/0.5ML ~~LOC~~ SOAJ
0.2500 mg | SUBCUTANEOUS | 0 refills | Status: DC
  Filled 2022-06-04: qty 2, 28d supply, fill #0

## 2022-06-04 MED ORDER — WEGOVY 0.5 MG/0.5ML ~~LOC~~ SOAJ
0.5000 mg | SUBCUTANEOUS | 1 refills | Status: DC
  Filled 2022-06-04: qty 2, 28d supply, fill #0

## 2022-06-04 MED ORDER — LEVOTHYROXINE SODIUM 112 MCG PO TABS
112.0000 ug | ORAL_TABLET | Freq: Every morning | ORAL | 3 refills | Status: DC
  Filled 2022-06-04: qty 90, 90d supply, fill #0
  Filled 2022-09-05: qty 90, 90d supply, fill #1
  Filled 2022-12-07 – 2022-12-11 (×2): qty 90, 90d supply, fill #2
  Filled 2023-03-10: qty 90, 90d supply, fill #3

## 2022-06-04 NOTE — Telephone Encounter (Signed)
Initiated Prior authorization [id number] 0.25mg  (tiered strengths)  Via: Covermymeds Case/Key:[id number]) Status: approved  as of 06/04/22 Reason:The authorization is effective for a maximum of 7 fills from 06/04/2022 to 12/24/2022, as long as the member is enrolled in their current health plan. The request was approved as submitted. The request was approved for 44mL per 28 days.Additional prior authorizations (PA) have been entered [id number] 0.5mg /0.66mL allowing a maximum of 7 fills with a quantity limit of 35mL per 28 days ([place] 8006)Wegovy 1mg /0.68mL allowing a maximum of 7 fills with a quantity limit of 60mL per 28 days ([place] 8007)Wegovy 1.7mg /0.31mL allowing a maximum of 7 fills with a quantity limit of 25mL per 28 days ([place] 8008)Wegovy 2.4mg /0.16mL allowing a maximum of 7 fills with a quantity limit of 71mL per 28 days (PA 8009)These authorizations are effective 06/04/2022 through 12/24/2022. A written notification letter will follow with additional details. Notified Pt via: Mychart

## 2022-06-04 NOTE — Progress Notes (Addendum)
Cholesterol looks good.  HDL a little low. Weight loss and exercise can help with that.   Vitamin D is low. I don't see on med list are you taking any?   Glucose, kidney, liver look great.   Thyroid looks great. Will send refills.   B12 is normal but on the low side. Start B12 daily.  Your iron is pretty low. You need to be on iron supplement. Have you tolerated ok in the past. I would suggest 325mg  ferrous sulfate once daily with breakfast.    Start wegovy for weight loss. Sent to pharmacy.

## 2022-06-04 NOTE — Addendum Note (Signed)
Addended by: Jomarie Longs on: 06/04/2022 08:04 AM   Modules accepted: Orders

## 2022-06-05 ENCOUNTER — Other Ambulatory Visit (HOSPITAL_COMMUNITY): Payer: Self-pay

## 2022-06-06 LAB — CBC WITH DIFFERENTIAL/PLATELET
Absolute Monocytes: 383 cells/uL (ref 200–950)
Basophils Absolute: 50 cells/uL (ref 0–200)
Basophils Relative: 0.7 %
Eosinophils Absolute: 483 cells/uL (ref 15–500)
Eosinophils Relative: 6.8 %
HCT: 33.9 % — ABNORMAL LOW (ref 35.0–45.0)
Hemoglobin: 9.9 g/dL — ABNORMAL LOW (ref 11.7–15.5)
Lymphs Abs: 2641 cells/uL (ref 850–3900)
MCH: 21.3 pg — ABNORMAL LOW (ref 27.0–33.0)
MCV: 73.1 fL — ABNORMAL LOW (ref 80.0–100.0)
MPV: 11.4 fL (ref 7.5–12.5)
Monocytes Relative: 5.4 %
Neutro Abs: 3543 cells/uL (ref 1500–7800)
Neutrophils Relative %: 49.9 %
RBC: 4.64 10*6/uL (ref 3.80–5.10)
Total Lymphocyte: 37.2 %
WBC: 7.1 10*3/uL (ref 3.8–10.8)

## 2022-06-06 LAB — TEST AUTHORIZATION

## 2022-06-06 LAB — IRON,TIBC AND FERRITIN PANEL
%SAT: 8 % (calc) — ABNORMAL LOW (ref 16–45)
Ferritin: 6 ng/mL — ABNORMAL LOW (ref 16–154)
Iron: 37 ug/dL — ABNORMAL LOW (ref 40–190)
TIBC: 472 mcg/dL (calc) — ABNORMAL HIGH (ref 250–450)

## 2022-06-06 LAB — HEPATITIS C ANTIBODY
Hepatitis C Ab: NONREACTIVE
SIGNAL TO CUT-OFF: 0.07 (ref <1.00)

## 2022-06-06 LAB — COMPLETE METABOLIC PANEL WITH GFR
AG Ratio: 1.7 (calc) (ref 1.0–2.5)
ALT: 12 U/L (ref 6–29)
AST: 14 U/L (ref 10–30)
Albumin: 4.5 g/dL (ref 3.6–5.1)
Alkaline phosphatase (APISO): 73 U/L (ref 31–125)
BUN: 8 mg/dL (ref 7–25)
CO2: 23 mmol/L (ref 20–32)
Calcium: 9.2 mg/dL (ref 8.6–10.2)
Chloride: 105 mmol/L (ref 98–110)
Creat: 0.69 mg/dL (ref 0.50–0.97)
Globulin: 2.7 g/dL (calc) (ref 1.9–3.7)
Potassium: 4.2 mmol/L (ref 3.5–5.3)
Sodium: 139 mmol/L (ref 135–146)
Total Bilirubin: 0.6 mg/dL (ref 0.2–1.2)

## 2022-06-06 LAB — VITAMIN D 25 HYDROXY (VIT D DEFICIENCY, FRACTURES): Vit D, 25-Hydroxy: 17 ng/mL — ABNORMAL LOW (ref 30–100)

## 2022-06-06 LAB — LIPID PANEL W/REFLEX DIRECT LDL
Cholesterol: 138 mg/dL (ref <200)
HDL: 42 mg/dL — ABNORMAL LOW (ref >=50)
LDL Cholesterol (Calc): 78 mg/dL (calc)
Non-HDL Cholesterol (Calc): 96 mg/dL (calc) (ref <130)
Total CHOL/HDL Ratio: 3.3 (calc) (ref <5.0)
Triglycerides: 101 mg/dL (ref <150)

## 2022-06-06 LAB — B12 AND FOLATE PANEL
Folate: 12.6 ng/mL
Vitamin B-12: 309 pg/mL (ref 200–1100)

## 2022-06-06 LAB — TSH: TSH: 1.54 mIU/L

## 2022-06-06 LAB — HEMOGLOBIN A1C W/OUT EAG: Hgb A1c MFr Bld: 5.9 % of total Hgb — ABNORMAL HIGH (ref <5.7)

## 2022-06-07 NOTE — Progress Notes (Signed)
A1C 5.9. very close to finger stick in office. In pre-diabetes range. Will recheck in 6 months.

## 2022-06-14 ENCOUNTER — Encounter: Payer: Self-pay | Admitting: Family Medicine

## 2022-06-14 ENCOUNTER — Other Ambulatory Visit (HOSPITAL_COMMUNITY): Payer: Self-pay

## 2022-06-14 ENCOUNTER — Ambulatory Visit (INDEPENDENT_AMBULATORY_CARE_PROVIDER_SITE_OTHER): Payer: No Typology Code available for payment source | Admitting: Family Medicine

## 2022-06-14 VITALS — BP 130/90 | HR 94 | Temp 99.2°F | Ht 63.5 in | Wt 235.0 lb

## 2022-06-14 DIAGNOSIS — J4520 Mild intermittent asthma, uncomplicated: Secondary | ICD-10-CM | POA: Diagnosis not present

## 2022-06-14 DIAGNOSIS — J069 Acute upper respiratory infection, unspecified: Secondary | ICD-10-CM | POA: Diagnosis not present

## 2022-06-14 DIAGNOSIS — J029 Acute pharyngitis, unspecified: Secondary | ICD-10-CM

## 2022-06-14 DIAGNOSIS — R52 Pain, unspecified: Secondary | ICD-10-CM

## 2022-06-14 LAB — POCT RAPID STREP A (OFFICE): Rapid Strep A Screen: NEGATIVE

## 2022-06-14 MED ORDER — FLUTICASONE-SALMETEROL 250-50 MCG/ACT IN AEPB
1.0000 | INHALATION_SPRAY | Freq: Two times a day (BID) | RESPIRATORY_TRACT | 3 refills | Status: DC
  Filled 2022-06-14: qty 60, 30d supply, fill #0
  Filled 2022-12-07 – 2022-12-10 (×5): qty 60, 30d supply, fill #1

## 2022-06-14 MED ORDER — FLUTICASONE-SALMETEROL 115-21 MCG/ACT IN AERO
2.0000 | INHALATION_SPRAY | Freq: Two times a day (BID) | RESPIRATORY_TRACT | 3 refills | Status: DC
  Filled 2022-06-14: qty 12, 30d supply, fill #0

## 2022-06-15 LAB — NOVEL CORONAVIRUS, NAA: SARS-CoV-2, NAA: NOT DETECTED

## 2022-06-17 ENCOUNTER — Other Ambulatory Visit (HOSPITAL_COMMUNITY): Payer: Self-pay

## 2022-06-17 MED ORDER — PREDNISONE 20 MG PO TABS
40.0000 mg | ORAL_TABLET | Freq: Every day | ORAL | 0 refills | Status: DC
  Filled 2022-06-17: qty 10, 5d supply, fill #0

## 2022-06-17 MED ORDER — HYDROCODONE BIT-HOMATROP MBR 5-1.5 MG/5ML PO SOLN
5.0000 mL | Freq: Three times a day (TID) | ORAL | 0 refills | Status: DC | PRN
  Filled 2022-06-17: qty 60, 4d supply, fill #0

## 2022-06-18 ENCOUNTER — Other Ambulatory Visit (HOSPITAL_COMMUNITY): Payer: Self-pay

## 2022-06-28 ENCOUNTER — Other Ambulatory Visit (HOSPITAL_COMMUNITY): Payer: Self-pay

## 2022-07-31 ENCOUNTER — Telehealth: Payer: Self-pay | Admitting: Physician Assistant

## 2022-07-31 ENCOUNTER — Other Ambulatory Visit (HOSPITAL_COMMUNITY): Payer: Self-pay

## 2022-07-31 MED ORDER — WEGOVY 1 MG/0.5ML ~~LOC~~ SOAJ
1.0000 mg | SUBCUTANEOUS | 0 refills | Status: DC
  Filled 2022-07-31: qty 2, 28d supply, fill #0

## 2022-07-31 NOTE — Telephone Encounter (Signed)
Pt is doing great on wegovy. Minimal nausea. Sent 1mg  dose and follow up in 1 month.

## 2022-09-06 ENCOUNTER — Telehealth: Payer: Self-pay | Admitting: Neurology

## 2022-09-06 ENCOUNTER — Other Ambulatory Visit (HOSPITAL_COMMUNITY): Payer: Self-pay

## 2022-09-06 DIAGNOSIS — F419 Anxiety disorder, unspecified: Secondary | ICD-10-CM

## 2022-09-06 NOTE — Telephone Encounter (Signed)
Referral for counseling placed per [facility].

## 2022-09-11 ENCOUNTER — Other Ambulatory Visit (HOSPITAL_COMMUNITY): Payer: Self-pay

## 2022-09-11 ENCOUNTER — Ambulatory Visit (INDEPENDENT_AMBULATORY_CARE_PROVIDER_SITE_OTHER): Payer: No Typology Code available for payment source | Admitting: Physician Assistant

## 2022-09-11 ENCOUNTER — Encounter: Payer: Self-pay | Admitting: Physician Assistant

## 2022-09-11 VITALS — BP 149/93 | HR 82 | Ht 63.5 in | Wt 225.0 lb

## 2022-09-11 DIAGNOSIS — E538 Deficiency of other specified B group vitamins: Secondary | ICD-10-CM

## 2022-09-11 DIAGNOSIS — R030 Elevated blood-pressure reading, without diagnosis of hypertension: Secondary | ICD-10-CM | POA: Diagnosis not present

## 2022-09-11 DIAGNOSIS — F439 Reaction to severe stress, unspecified: Secondary | ICD-10-CM

## 2022-09-11 DIAGNOSIS — Z79899 Other long term (current) drug therapy: Secondary | ICD-10-CM

## 2022-09-11 DIAGNOSIS — D508 Other iron deficiency anemias: Secondary | ICD-10-CM

## 2022-09-11 DIAGNOSIS — E6609 Other obesity due to excess calories: Secondary | ICD-10-CM | POA: Diagnosis not present

## 2022-09-11 DIAGNOSIS — F418 Other specified anxiety disorders: Secondary | ICD-10-CM | POA: Diagnosis not present

## 2022-09-11 DIAGNOSIS — Z6839 Body mass index (BMI) 39.0-39.9, adult: Secondary | ICD-10-CM

## 2022-09-11 DIAGNOSIS — E559 Vitamin D deficiency, unspecified: Secondary | ICD-10-CM

## 2022-09-11 MED ORDER — WEGOVY 1.7 MG/0.75ML ~~LOC~~ SOAJ
1.7000 mg | SUBCUTANEOUS | 0 refills | Status: DC
  Filled 2022-09-11: qty 3, 28d supply, fill #0

## 2022-09-11 MED ORDER — WEGOVY 2.4 MG/0.75ML ~~LOC~~ SOAJ
2.4000 mg | SUBCUTANEOUS | 5 refills | Status: DC
  Filled 2022-09-11: qty 3, fill #0
  Filled 2022-10-07: qty 3, 28d supply, fill #0
  Filled 2022-10-30: qty 3, 28d supply, fill #1
  Filled 2022-11-25: qty 3, 28d supply, fill #2
  Filled 2022-12-24 – 2022-12-25 (×2): qty 3, 28d supply, fill #3
  Filled 2023-01-16: qty 3, 28d supply, fill #4
  Filled 2023-02-13: qty 3, 28d supply, fill #5

## 2022-09-11 NOTE — Progress Notes (Signed)
Established Patient Office Visit  Subjective   Patient ID: Cathy Daniels, female    DOB: 01-21-1986  Age: 36 y.o. MRN: [id number]  Chief Complaint  Patient presents with   Follow-up    HPI Patient is a 36 year old obese female who presents to the clinic to follow-up on Wegovy and weight loss.  She is tolerating Wegovy well.  She is at the 1 mg dose and is tolerating.  She is down 10 pounds.  She denies any worrisome nausea, constipation, epigastric pain.  She is moving more and eating less.  Patient is having increasingly hard time with her anxiety.  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  This is really bothering her.  It is hard for her to not think of anything except this.  Is hard for her to do things for fun.  She is constantly thinking about her daughter's grades and performance.  She is scheduled to talk with counselor next week.  She is not checking her blood pressure at home.  She denies any chest pains, palpitations, headaches or vision changes.  She does not want started blood pressure medication at this time.  Patient had some abnormal labs with iron/b12/vitamin D she has been taking supplements and wonders if they are still low or better.   .. Active Ambulatory Problems    Diagnosis Date Noted   ALLERGIC RHINITIS CAUSE UNSPECIFIED 03/30/2010   Asthma, moderate persistent 04/19/2008   Vaginal delivery 12/17/2013   Hypothyroidism 01/28/2017   History of prediabetes 01/28/2017   History of gestational diabetes 01/28/2017   Irregular periods 01/28/2017   Eczema of both hands 01/28/2017   Situational anxiety 01/28/2017   History of asthma 01/28/2017   Menstrual cramp 01/28/2017   Vitamin D deficiency 02/03/2017   Diabetes mellitus affecting pregnancy in third trimester 12/22/2018   NSVD (normal spontaneous vaginal delivery) 12/23/2018   Pre-diabetes 06/03/2022   Iron deficiency anemia secondary to inadequate dietary  iron intake 06/03/2022   Elevated blood pressure reading 06/03/2022   Newly recognized heart murmur 06/03/2022   Class 2 obesity due to excess calories without serious comorbidity with body mass index (BMI) of 39.0 to 39.9 in adult 06/03/2022   Low serum vitamin B12 06/04/2022   Stress 09/11/2022   Resolved Ambulatory Problems    Diagnosis Date Noted   Rash and other nonspecific skin eruption 03/30/2010   Pregnancy 12/17/2013   Past Medical History:  Diagnosis Date   Abnormal pap 2007, 2008   Abnormal Pap smear    Anemia    Asthma    Genital warts    GERD (gastroesophageal reflux disease)    Gestational diabetes    Hypothyroid    Infection 2017   Pregnancy induced hypertension    Seasonal allergies    Vaginal Pap smear, abnormal        ROS See HPI.    Objective:     BP (!) 149/93   Pulse 82   Ht 5' 3.5" (1.613 m)   Wt 225 lb (102.1 kg)   SpO2 100%   BMI 39.23 kg/m  BP Readings from Last 3 Encounters:  09/11/22 (!) 149/93  06/14/22 130/90  06/03/22 (!) 148/83   Wt Readings from Last 3 Encounters:  09/11/22 225 lb (102.1 kg)  06/14/22 235 lb (106.6 kg)  06/03/22 235 lb (106.6 kg)      Physical Exam Constitutional:      Appearance: Normal appearance. She  is obese.  HENT:     Head: Normocephalic.  Cardiovascular:     Rate and Rhythm: Normal rate and regular rhythm.     Heart sounds: Murmur heard.  Pulmonary:     Effort: Pulmonary effort is normal.  Musculoskeletal:     Right lower leg: No edema.     Left lower leg: No edema.  Neurological:     General: No focal deficit present.     Mental Status: She is alert and oriented to person, place, and time.  Psychiatric:        Mood and Affect: Mood normal.         Assessment & Plan:  Marland KitchenMarland KitchenJonel was seen today for follow-up.  Diagnoses and all orders for this visit:  Class 2 obesity due to excess calories without serious comorbidity with body mass index (BMI) of 39.0 to 39.9 in adult -      WEGOVY 1.7 MG/0.75ML SOAJ; Inject 1.7 mg into the skin once a week. Use this dose for 1 month (4 shots) and then increase to next higher dose. -     WEGOVY 2.4 MG/0.75ML SOAJ; Inject 2.4 mg into the skin once a week.  Situational anxiety  Stress  Elevated blood pressure reading  Medication management -     B12 and Folate Panel -     Fe+TIBC+Fer -     VITAMIN D 25 Hydroxy (Vit-D Deficiency, Fractures)  Low serum vitamin B12 -     B12 and Folate Panel  Vitamin D deficiency -     VITAMIN D 25 Hydroxy (Vit-D Deficiency, Fractures)  Iron deficiency anemia secondary to inadequate dietary iron intake -     Fe+TIBC+Fer   Down 10lbs  Increased wegovy to 1.7 and 2.4mg  weekly Follow up in 6 months  Recheck labs for deficiency found in June  Discussed elevated BP Pt feels like it is her increase in anxiety/stress She does not want to treat BP or anxiety right now She will start counseling with Juliann Pulse next week Follow up in 2 weeks for BP follow up  Spent 35 minutes with patient reviewing chart, discussing plans and anxiety.    Iran Planas, [name]

## 2022-09-12 ENCOUNTER — Other Ambulatory Visit (HOSPITAL_COMMUNITY): Payer: Self-pay

## 2022-09-13 ENCOUNTER — Other Ambulatory Visit (HOSPITAL_COMMUNITY): Payer: Self-pay

## 2022-09-17 LAB — VITAMIN D 25 HYDROXY (VIT D DEFICIENCY, FRACTURES): Vit D, 25-Hydroxy: 24 ng/mL — ABNORMAL LOW (ref 30–100)

## 2022-09-17 LAB — IRON,TIBC AND FERRITIN PANEL
%SAT: 6 % (calc) — ABNORMAL LOW (ref 16–45)
Ferritin: 6 ng/mL — ABNORMAL LOW (ref 16–154)
Iron: 24 ug/dL — ABNORMAL LOW (ref 40–190)
TIBC: 413 mcg/dL (calc) (ref 250–450)

## 2022-09-17 LAB — B12 AND FOLATE PANEL
Folate: 10.7 ng/mL
Vitamin B-12: 359 pg/mL (ref 200–1100)

## 2022-09-17 NOTE — Progress Notes (Signed)
How much iron are you taking? Iron stores low, serum iron lower than 3 months ago.   B12 improving.   Vitamin D is better but still not to goal. I think we should do high dose for a few months.

## 2022-09-18 ENCOUNTER — Ambulatory Visit (INDEPENDENT_AMBULATORY_CARE_PROVIDER_SITE_OTHER): Payer: No Typology Code available for payment source

## 2022-09-18 ENCOUNTER — Other Ambulatory Visit: Payer: Self-pay | Admitting: Physician Assistant

## 2022-09-18 ENCOUNTER — Other Ambulatory Visit (HOSPITAL_COMMUNITY): Payer: Self-pay

## 2022-09-18 ENCOUNTER — Ambulatory Visit (INDEPENDENT_AMBULATORY_CARE_PROVIDER_SITE_OTHER): Payer: No Typology Code available for payment source | Admitting: Professional

## 2022-09-18 ENCOUNTER — Encounter: Payer: Self-pay | Admitting: Professional

## 2022-09-18 DIAGNOSIS — F4322 Adjustment disorder with anxiety: Secondary | ICD-10-CM

## 2022-09-18 DIAGNOSIS — Z23 Encounter for immunization: Secondary | ICD-10-CM

## 2022-09-18 MED ORDER — VITAMIN D (ERGOCALCIFEROL) 1.25 MG (50000 UNIT) PO CAPS
50000.0000 [IU] | ORAL_CAPSULE | ORAL | 3 refills | Status: DC
  Filled 2022-09-18: qty 12, 84d supply, fill #0

## 2022-09-18 NOTE — Progress Notes (Unsigned)
° ° ° ° ° ° ° ° ° ° ° ° ° ° °  Clevland Cork, LCMHC °

## 2022-09-18 NOTE — Progress Notes (Addendum)
[facility] Counselor Initial Adult Exam  Name: Cathy Daniels Date: 09/19/2022 MRN: [id number] DOB: [birth year] PCP: Cathy Daniels, Cathy Daniels  Time spent: 57 minutes 3-357pm  Guardian/Payee:  self    Paperwork requested: No   Reason for Visit /Presenting Problem: This session was held via video teletherapy. The patient consented to video teletherapy and was located at her home during this session. She is aware it is the responsibility of the patient to secure confidentiality on her end of the session. The provider was in a private home office for the duration of this session.    The patient arrived on time for her webex appointment.  The patient reports she is in school and have two little children with her spouse. She has been dealing with a lot lately. Patient has been having a hard time turning things off in her brain. She has been against medication and her mood. 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 Her 3rd grade daughter has struggled in school and it has been hard to accept that her beginning of grade exam was not up to par. The patient reports she was unable to shut off her brain and has been worrying all the time. This is the first time she has not been able to turn off her thinking, is not focusing on her assignments, and is laser focused on her and her schooling. Patient blames herself for her daughter scoring poorly on the BOG despite the fact that her daughter's teacher is not concerned.  Mental Status Exam: Appearance:   Casual     Behavior:  Sharing  Motor:  Normal  Speech/Language:   Clear and Coherent and Normal Rate  Affect:  Full Range  Mood:  anxious  Thought process:  goal directed  Thought content:    WNL  Sensory/Perceptual disturbances:    WNL  Orientation:  oriented to person, place, time/date, and situation  Attention:  Good  Concentration:  Good  Memory:  WNL  Fund  of knowledge:   Good  Insight:    Good  Judgment:   Good  Impulse Control:  Good   Risk Assessment: Danger to Self:  No Self-injurious Behavior: No Danger to Others: No Duty to Warn:no Physical Aggression / Violence:No  Access to Firearms a concern: No  Gang Involvement:No  Patient / guardian was educated about steps to take if suicide or homicide risk level increases between visits: n/a While future psychiatric events cannot be accurately predicted, the patient does not currently require acute inpatient psychiatric care and does not [facility].  No family history of MH, it's frowned upon in [place]. Her spouse, her parents, and her brother do not believe.  Substance Abuse History: Current substance abuse: Yes   drinks one time per month to less than and will have one half glass of wine. No OTC misuse.  Past Psychiatric History:   No previous psychological problems have been observed Outpatient Providers: 2016 had a few EAP counseling sessions due to situational stress when her mother in law was living with the family History of Psych Hospitalization: No  Psychological Testing: none   Abuse History:  Victim of: No.,  none    Report needed: No. Victim of Neglect:No. Perpetrator of  none   Witness / Exposure to Domestic Violence: No   Protective Services Involvement: No  Witness to [employer] Violence:  No  Family History:  Family History  Problem Relation Age of Onset   Diabetes Father    Hypertension Mother    Diabetes Other        PU x 5, PA x 1   Glaucoma Maternal Grandmother    Hypertension Maternal Grandfather    Hearing loss Neg Hx    Living situation: the patient lives in a single family home with her family  Sexual Orientation: Straight  Relationship Status: married  Name of spouse / other: Cathy Daniels married 14 years after meeting online and talking for one year before meeting face-to-face, they then got engaged, and  got married a 1.5 years later. 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 She goes to the temple in [facility] and will go to the [facility] temple at times.  Any cultural differences that may affect / interfere with treatment:  culture plays a major role but she does not like to take medication and does not see it will interfere with treatment  Recreation/Hobbies: enjoys time with friends as able, enjoys Netflix  Stressors: Health problems  , marital conflict  Strengths: Supportive Relationships, Family, Friends, Spirituality, Hopefulness, and Able to [employer], joyful, can leave stressors outside of office  Barriers:  schedule between work or school   Legal History: Pending legal issue / charges: The patient has no significant history of legal issues. History of legal issue / charges:  n/a  Medical History/Surgical History: reviewed Past Medical History:  Diagnosis Date   Abnormal pap 2007, 2008   colpo x 3   Abnormal Pap smear    colpo- ok since   Anemia    Asthma    smoke induced, no inhaler used in  over a year   Genital warts    GERD (gastroesophageal reflux disease)    Gestational diabetes    Hypothyroid    Infection 2017   UTI   Pregnancy induced hypertension    Seasonal allergies    Vaginal Pap smear, abnormal     had colpo- normal since    Past Surgical History:  Procedure Laterality Date   COLPOSCOPY  3/07, 8/07, 1/08   CIN II, benign, CIN I   NO PAST SURGERIES     WISDOM TOOTH EXTRACTION      Medications: Current Outpatient Medications  Medication Sig Dispense Refill   fluticasone-salmeterol (ADVAIR DISKUS) 250-50 MCG/ACT AEPB Inhale 1 puff into the lungs in the morning and at bedtime. 60 each 3   ibuprofen (ADVIL,MOTRIN) 600 MG tablet Take 1 tablet (600 mg total) by mouth every 6 (six) hours as needed for cramping. 40 tablet 1   levothyroxine (SYNTHROID) 112 MCG tablet TAKE 1 TABLET BY MOUTH EVERY MORNING ON AN EMPTY STOMACH 90 tablet 3   Vitamin D, Ergocalciferol, (DRISDOL) 1.25 MG (50000 UNIT) CAPS capsule Take 1 capsule (50,000 Units total) by mouth every 7 (  seven) days. Take for 8 total doses(weeks) 12 capsule 3   WEGOVY 1.7 MG/0.75ML SOAJ Inject 1.7 mg into the skin once a week. Use this dose for 1 month (4 shots) and then increase to next higher dose. 3 mL 0   [START ON 10/11/2022] WEGOVY 2.4 MG/0.75ML SOAJ Inject 2.4 mg into the skin once a week. 3 mL 5   No current facility-administered medications for this visit.    Allergies  Allergen Reactions   Ferrous Sulfate Hives    Repliva OK   Sulfonamide Derivatives Hives   Latex Hives, Itching and Rash    Itching and rash when wearing latex gloves   Natural stress reliever Ashwaghanda- minimal effectiveness  Diagnoses:  Adjustment disorder with anxiety  Plan of Care: -meet again on Wednesday, October 02, 2022 at 4pm.

## 2022-09-19 DIAGNOSIS — F4322 Adjustment disorder with anxiety: Secondary | ICD-10-CM | POA: Insufficient documentation

## 2022-10-01 ENCOUNTER — Other Ambulatory Visit (HOSPITAL_COMMUNITY): Payer: Self-pay

## 2022-10-02 ENCOUNTER — Ambulatory Visit (INDEPENDENT_AMBULATORY_CARE_PROVIDER_SITE_OTHER): Payer: No Typology Code available for payment source | Admitting: Professional

## 2022-10-02 DIAGNOSIS — F4322 Adjustment disorder with anxiety: Secondary | ICD-10-CM | POA: Diagnosis not present

## 2022-10-07 ENCOUNTER — Other Ambulatory Visit (HOSPITAL_COMMUNITY): Payer: Self-pay

## 2022-10-07 ENCOUNTER — Encounter: Payer: Self-pay | Admitting: Professional

## 2022-10-07 NOTE — Progress Notes (Signed)
° ° ° ° ° ° ° ° ° ° ° ° ° ° °  Salam Chesterfield, LCMHC °

## 2022-10-07 NOTE — Progress Notes (Signed)
[facility] Counselor/Therapist Progress Note  Patient ID: Cathy Daniels, MRN: [id number],    Date: 10/07/2022  Time Spent: 54 minutes [id number]   Treatment Type: Individual Therapy  Risk Assessment: Danger to Self:  No Self-injurious Behavior: No Danger to Others: No  Subjective: This session was held via video teletherapy. The patient consented to video teletherapy and was located at her home during this session. She is aware it is the responsibility of the patient to secure confidentiality on her end of the session. The provider was in a private home office for the duration of this session.    The patient arrived on time for her webex appointment.  Issues addressed: 1-treatment planning -pt and Clinician completed development of pt's treatment plan -pt fully participated and agrees with her treatment plan   Treatment Plan Problems Addressed  Anxiety, Balancing Work and Family/Multiple Roles, Body Image Disturbance/Eating Disorders, Low Self-Esteem/Lack of Assertiveness   Goals 1. Address and eliminate maladaptive thought processes that lead to anxious responses. 2. Change the definition of self to encompass positive attributes beyond body weight, size, and shape. 3. Decrease dependence on relationships while beginning to meet own needs, build confidence, and practice assertiveness. Objective Increase saying no to others' requests. Target Date: 2023-10-02 Frequency: biweekly  Progress: 0 Modality: individual  Objective Identify own emotional and social needs and ways to fulfill them. Target Date: 2023-10-02 Frequency: biweekly  Progress: 0 Modality: individual  4. Develop a support system to assist with multiple roles and responsibilities. Objective Implement assertiveness skills and limit setting. Target Date: 2023-10-02 Frequency: biweekly  Progress: 0 Modality: individual  Objective Clarify values and priorities. Target Date: 2023-10-02 Frequency:  biweekly  Progress: 0 Modality: individual  Objective Identify at least five practical ways to manage stress associated with multiple demands. Target Date: 2023-10-02 Frequency: biweekly  Progress: 0 Modality: individual  Objective Commit to a healthy eating, sleeping, and exercise routine. Target Date: 2023-10-02 Frequency: biweekly  Progress: 0 Modality: individual  Objective Describe role and responsibilities associated with work and family and related thoughts, feelings, and behaviors. Target Date: 2023-10-02 Frequency: biweekly  Progress: 0 Modality: individual  Objective Learn and implement stress management and relaxation techniques to reduce fatigue, anxiety, and depressive symptoms. Target Date: 2023-10-02 Frequency: biweekly  Progress: 0 Modality: individual  Objective Generate a list of self-care activities and make a commitment to regularly participate in such activities. Target Date: 2023-10-02 Frequency: biweekly  Progress: 0 Modality: individual  5. Develop an awareness of internalized unrealistic and narrow standards of women's beauty and learn to challenge such beliefs. 6. Develop coping strategies (e.g., feeling identification, problem-solving, assertiveness) to address emotional issues that could lead to relapse of the eating disorder. 7. Develop healthy cognitive patterns and beliefs about self that lead to positive identity and prevent a relapse of the eating disorder. 8. Develop time management strategies to reduce role overload and role conflicts. 9. Eliminate depressive and anxiety symptoms associated with trying to balance multiple roles. 10. Enhance ability to handle effectively life stressors. 11. Improve self-esteem and develop a positive self-image as capable and competent. 12. Increase ability to express needs and desires openly and honestly. Objective Acknowledge self-disparaging statements and recognize the tendency to engage in such statements. Target  Date: 2023-10-02 Frequency: biweekly  Progress: 0 Modality: individual  Objective Practice saying "no" to at least one person, declining to comply with a request/favor or identify and assert rights, needs, and wants. Target Date: 2023-10-02 Frequency: biweekly  Progress: 0 Modality: individual  Objective  Verbalize an understanding of the differences between passive, assertive, and aggressive behavior. Target Date: 2023-10-02 Frequency: biweekly  Progress: 0 Modality: individual  13. Increase assertiveness skills and ability to advocate for self. 14. Increase overall sense of well-being via reduction/elimination of anxiety. 15. Maintain a balance between the multiple demands of motherhood, work, and other life roles and responsibilities. 16. Reach a level of reduced tension, increased satisfaction, and improved communication with husband and/or other authority figures. 17. Reduce self-disparaging remarks and negative self-talk. 18. Resolve issues involving low self-esteem or low self-efficacy that contribute to anxiety. Objective Verbalize the degree to which current life circumstances could be improved in areas such as work, home, and school. Target Date: 2023-10-02 Frequency: biweekly  Progress: 0 Modality: individual  Objective Establish a support system of trusting individuals in order to discuss life stressors and help reduce anxiety. Target Date: 2023-10-02 Frequency: biweekly  Progress: 0 Modality: individual  19. Restore normal eating patterns, healthy weight maintenance, and a realistic appraisal of body size. Objective Establish regular eating patterns by eating at regular intervals and consuming recommended daily calories. Target Date: 2023-10-02 Frequency: biweekly  Progress: 0 Modality: individual  Objective Honestly describe the pattern of eating including types, amounts, and frequency of food consumed. Target Date: 2023-10-02 Frequency: biweekly  Progress: 0 Modality:  individual  Objective State a basis for positive identity that is not based on weight and appearance but on character, traits, relationships, and intrinsic value. Target Date: 2023-10-02 Frequency: biweekly  Progress: 0 Modality: individual  Objective Verbalize an accurate understanding of how eating disorders develop. Target Date: 2023-10-02 Frequency: biweekly  Progress: 0 Modality: individual  20. Terminate the pattern of binge eating behavior and return to normal eating of foods and establish healthy body weight. Objective Identify and strengthen ties to people and circumstances that affirm self-worth. Target Date: 2023-10-02 Frequency: biweekly  Progress: 0 Modality: individual  Objective Identify and change distorted self-talk messages associated with eating behavior. Target Date: 2023-10-02 Frequency: biweekly  Progress: 0 Modality: individual  Objective Verbalize acceptance of control, self-determination, and responsibility for one's body image and eating behavior. Target Date: 2023-10-02 Frequency: biweekly  Progress: 0 Modality: individual  Objective Describe eating patterns, including frequency, amounts, and types of food consumed. Target Date: 2023-10-02 Frequency: biweekly  Progress: 0 Modality: individual  Objective Verbalize a more positive body image that is disconnected from rigid/narrow sociocultural messages. Target Date: 2023-10-02 Frequency: biweekly  Progress: 0 Modality: individual    Diagnosis:Adjustment disorder with anxiety  Plan:  -meet again on Wednesday, October 30, 2022 at 3pm.

## 2022-10-08 ENCOUNTER — Other Ambulatory Visit (HOSPITAL_COMMUNITY): Payer: Self-pay

## 2022-10-30 ENCOUNTER — Ambulatory Visit: Payer: No Typology Code available for payment source | Admitting: Professional

## 2022-10-30 ENCOUNTER — Other Ambulatory Visit (HOSPITAL_COMMUNITY): Payer: Self-pay

## 2022-11-13 ENCOUNTER — Encounter: Payer: Self-pay | Admitting: Professional

## 2022-11-13 ENCOUNTER — Ambulatory Visit (INDEPENDENT_AMBULATORY_CARE_PROVIDER_SITE_OTHER): Payer: No Typology Code available for payment source | Admitting: Professional

## 2022-11-13 DIAGNOSIS — F4322 Adjustment disorder with anxiety: Secondary | ICD-10-CM | POA: Diagnosis not present

## 2022-11-13 NOTE — Progress Notes (Signed)
[facility] Counselor/Therapist Progress Note  Patient ID: Cathy Daniels, MRN: [id number],    Date: 11/13/2022  Time Spent: 58 minutes [phone]   Treatment Type: Individual Therapy  Risk Assessment: Danger to Self:  No Self-injurious Behavior: No Danger to Others: No  Subjective: This session was held via video teletherapy. The patient consented to video teletherapy and was located at her home during this session. She is aware it is the responsibility of the patient to secure confidentiality on her end of the session. The provider was in a private home office for the duration of this session.    The patient arrived on time for her webex appointment.  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   -she took 45 days later and passed c-pt admits she surprised herself and her teacher how well she has performed 2-daughter Cathy Daniels a-doing much better in school -pt reminds her daughter that she only needs to do her best -pt has no expectation that her child will get 100% on everything b-parent conferences -pt, her daughter and teacher met when she was struggling   -Cathy Daniels cried throughout the meeting   -did not identify why she was struggling -pt met with teacher again who reported she is more focused and have turned around   -it was student led   -teacher reports she did not have concerns -consider tracking her sleep to see if sleep issues are a trigger -pt talked with pt's pediatrician about the possibility of ADHD   -this conversation was prior to her teacher raising concerns   -pediatrician asked some questions of Cathy Daniels     -there was no concern on the part of the MD -still will  occasionally have a low score -is it an attention issue -is it an issue of understanding -it is an anxiety issue  Treatment Plan Problems Addressed  Anxiety, Balancing Work and Family/Multiple Roles, Body Image Disturbance/Eating Disorders, Low Self-Esteem/Lack of Assertiveness   Goals 1. Address and eliminate maladaptive thought processes that lead to anxious responses. 2. Change the definition of self to encompass positive attributes beyond body weight, size, and shape. 3. Decrease dependence on relationships while beginning to meet own needs, build confidence, and practice assertiveness. Objective Increase saying no to others' requests. Target Date: 2023-10-02 Frequency: biweekly  Progress: 0 Modality: individual  Objective Identify own emotional and social needs and ways to fulfill them. Target Date: 2023-10-02 Frequency: biweekly  Progress: 0 Modality: individual  4. Develop a support system to assist with multiple roles and responsibilities. Objective Implement assertiveness skills and limit setting. Target Date: 2023-10-02 Frequency: biweekly  Progress: 0 Modality: individual  Objective Clarify values and priorities. Target Date: 2023-10-02 Frequency: biweekly  Progress: 0 Modality: individual  Objective Identify at least five practical ways to manage stress associated with multiple demands. Target Date: 2023-10-02 Frequency: biweekly  Progress: 0 Modality: individual  Objective Commit to a healthy eating, sleeping, and exercise routine. Target Date: 2023-10-02 Frequency: biweekly  Progress: 0 Modality: individual  Objective Describe role and responsibilities associated with work and family and related thoughts, feelings, and behaviors. Target Date: 2023-10-02 Frequency: biweekly  Progress: 0 Modality: individual  Objective Learn and implement stress management  and relaxation techniques to reduce fatigue, anxiety, and depressive symptoms. Target Date: 2023-10-02  Frequency: biweekly  Progress: 0 Modality: individual  Objective Generate a list of self-care activities and make a commitment to regularly participate in such activities. Target Date: 2023-10-02 Frequency: biweekly  Progress: 0 Modality: individual  5. Develop an awareness of internalized unrealistic and narrow standards of women's beauty and learn to challenge such beliefs. 6. Develop coping strategies (e.g., feeling identification, problem-solving, assertiveness) to address emotional issues that could lead to relapse of the eating disorder. 7. Develop healthy cognitive patterns and beliefs about self that lead to positive identity and prevent a relapse of the eating disorder. 8. Develop time management strategies to reduce role overload and role conflicts. 9. Eliminate depressive and anxiety symptoms associated with trying to balance multiple roles. 10. Enhance ability to handle effectively life stressors. 11. Improve self-esteem and develop a positive self-image as capable and competent. 12. Increase ability to express needs and desires openly and honestly. Objective Acknowledge self-disparaging statements and recognize the tendency to engage in such statements. Target Date: 2023-10-02 Frequency: biweekly  Progress: 0 Modality: individual  Objective Practice saying "no" to at least one person, declining to comply with a request/favor or identify and assert rights, needs, and wants. Target Date: 2023-10-02 Frequency: biweekly  Progress: 0 Modality: individual  Objective Verbalize an understanding of the differences between passive, assertive, and aggressive behavior. Target Date: 2023-10-02 Frequency: biweekly  Progress: 0 Modality: individual  13. Increase assertiveness skills and ability to advocate for self. 14. Increase overall sense of well-being via reduction/elimination of anxiety. 15. Maintain a balance between the multiple demands of motherhood, work, and other life roles and  responsibilities. 16. Reach a level of reduced tension, increased satisfaction, and improved communication with husband and/or other authority figures. 17. Reduce self-disparaging remarks and negative self-talk. 18. Resolve issues involving low self-esteem or low self-efficacy that contribute to anxiety. Objective Verbalize the degree to which current life circumstances could be improved in areas such as work, home, and school. Target Date: 2023-10-02 Frequency: biweekly  Progress: 0 Modality: individual  Objective Establish a support system of trusting individuals in order to discuss life stressors and help reduce anxiety. Target Date: 2023-10-02 Frequency: biweekly  Progress: 0 Modality: individual  19. Restore normal eating patterns, healthy weight maintenance, and a realistic appraisal of body size. Objective Establish regular eating patterns by eating at regular intervals and consuming recommended daily calories. Target Date: 2023-10-02 Frequency: biweekly  Progress: 0 Modality: individual  Objective Honestly describe the pattern of eating including types, amounts, and frequency of food consumed. Target Date: 2023-10-02 Frequency: biweekly  Progress: 0 Modality: individual  Objective State a basis for positive identity that is not based on weight and appearance but on character, traits, relationships, and intrinsic value. Target Date: 2023-10-02 Frequency: biweekly  Progress: 0 Modality: individual  Objective Verbalize an accurate understanding of how eating disorders develop. Target Date: 2023-10-02 Frequency: biweekly  Progress: 0 Modality: individual  20. Terminate the pattern of binge eating behavior and return to normal eating of foods and establish healthy body weight. Objective Identify and strengthen ties to people and circumstances that affirm self-worth. Target Date: 2023-10-02 Frequency: biweekly  Progress: 0 Modality: individual  Objective Identify and change  distorted self-talk messages associated with eating behavior. Target Date: 2023-10-02 Frequency: biweekly  Progress: 0 Modality: individual  Objective Verbalize acceptance of control, self-determination, and responsibility for one's body image and eating behavior. Target Date: 2023-10-02 Frequency: biweekly  Progress: 0 Modality: individual  Objective Describe eating patterns, including frequency, amounts, and types of food consumed. Target Date: 2023-10-02 Frequency: biweekly  Progress: 0 Modality: individual  Objective Verbalize a more positive body image that is disconnected from rigid/narrow sociocultural messages. Target Date: 2023-10-02 Frequency: biweekly  Progress: 0 Modality: individual    Diagnosis:Adjustment disorder with anxiety  Plan:  -meet again on Wednesday, November 27, 2022 at 3pm.

## 2022-11-25 ENCOUNTER — Other Ambulatory Visit (HOSPITAL_COMMUNITY): Payer: Self-pay

## 2022-11-27 ENCOUNTER — Ambulatory Visit (INDEPENDENT_AMBULATORY_CARE_PROVIDER_SITE_OTHER): Payer: No Typology Code available for payment source | Admitting: Professional

## 2022-11-27 ENCOUNTER — Encounter: Payer: Self-pay | Admitting: Professional

## 2022-11-27 DIAGNOSIS — F4322 Adjustment disorder with anxiety: Secondary | ICD-10-CM | POA: Diagnosis not present

## 2022-11-27 NOTE — Progress Notes (Signed)
[facility] Counselor/Therapist Progress Note  Patient ID: Cathy Daniels, MRN: [id number],    Date: 11/27/2022  Time Spent: 52 minutes [id number]   Treatment Type: Individual Therapy  Risk Assessment: Danger to Self:  No Self-injurious Behavior: No Danger to Others: No  Subjective: This session was held via video teletherapy. The patient consented to video teletherapy and was located in her car during this session. She is aware it is the responsibility of the patient to secure confidentiality on her end of the session. The provider was in a private home office for the duration of this session.    The patient arrived on time for her webex appointment.  Issues addressed: 1-school -completed her semester -has one semester to graduation 2-grief a-two deaths, family (SIL's dad) and a coworker's family -they had visited earlier this year, traveled together, got really close the pt's family   -he was only 11 and was a drinker   -her father is in the same age range and put things in perspective     -she called her father crying and asked him to stop drinking   -she and SIL same age, pt's daughter is same age as his granddaughter b-coworker's mother died and coworker informed at work that her mother had died in surgery -pt has struggled with this loss c-pt believes in reincarnation -depends upon what you did during your life and were very spiritual you would bypass reincarnation and go to heaven D-grandmother's death -"I played the guilty game" -asked a lot of questioning as to why she didn't invest more in the relationship 3-weekend -very busy 4-anxiety -cannot find the trigger -will be fine and then out of nowhere -doing better but doesn't understand why the anxiety -educated on The Cycle of Anxiety -difficulty identifying triggers   Treatment Plan Problems Addressed  Anxiety, Balancing Work and Family/Multiple Roles, Body Image Disturbance/Eating Disorders, Low  Self-Esteem/Lack of Assertiveness  Goals 1. Address and eliminate maladaptive thought processes that lead to anxious responses. 2. Change the definition of self to encompass positive attributes beyond body weight, size, and shape. 3. Decrease dependence on relationships while beginning to meet own needs, build confidence, and practice assertiveness. Objective Increase saying no to others' requests. Target Date: 2023-10-02 Frequency: biweekly  Progress: 0 Modality: individual  Objective Identify own emotional and social needs and ways to fulfill them. Target Date: 2023-10-02 Frequency: biweekly  Progress: 0 Modality: individual  4. Develop a support system to assist with multiple roles and responsibilities. Objective Implement assertiveness skills and limit setting. Target Date: 2023-10-02 Frequency: biweekly  Progress: 0 Modality: individual  Objective Clarify values and priorities. Target Date: 2023-10-02 Frequency: biweekly  Progress: 0 Modality: individual  Objective Identify at least five practical ways to manage stress associated with multiple demands. Target Date: 2023-10-02 Frequency: biweekly  Progress: 0 Modality: individual  Objective Commit to a healthy eating, sleeping, and exercise routine. Target Date: 2023-10-02 Frequency: biweekly  Progress: 0 Modality: individual  Objective Describe role and responsibilities associated with work and family and related thoughts, feelings, and behaviors. Target Date: 2023-10-02 Frequency: biweekly  Progress: 0 Modality: individual  Objective Learn and implement stress management and relaxation techniques to reduce fatigue, anxiety, and depressive symptoms. Target Date: 2023-10-02 Frequency: biweekly  Progress: 0 Modality: individual  Objective Generate a list of self-care activities and make a commitment to regularly participate in such activities. Target Date: 2023-10-02 Frequency: biweekly  Progress: 0 Modality: individual   5. Develop an awareness of internalized unrealistic and narrow standards of  women's beauty and learn to challenge such beliefs. 6. Develop coping strategies (e.g., feeling identification, problem-solving, assertiveness) to address emotional issues that could lead to relapse of the eating disorder. 7. Develop healthy cognitive patterns and beliefs about self that lead to positive identity and prevent a relapse of the eating disorder. 8. Develop time management strategies to reduce role overload and role conflicts. 9. Eliminate depressive and anxiety symptoms associated with trying to balance multiple roles. 10. Enhance ability to handle effectively life stressors. 11. Improve self-esteem and develop a positive self-image as capable and competent. 12. Increase ability to express needs and desires openly and honestly. Objective Acknowledge self-disparaging statements and recognize the tendency to engage in such statements. Target Date: 2023-10-02 Frequency: biweekly  Progress: 0 Modality: individual  Objective Practice saying "no" to at least one person, declining to comply with a request/favor or identify and assert rights, needs, and wants. Target Date: 2023-10-02 Frequency: biweekly  Progress: 0 Modality: individual  Objective Verbalize an understanding of the differences between passive, assertive, and aggressive behavior. Target Date: 2023-10-02 Frequency: biweekly  Progress: 0 Modality: individual  13. Increase assertiveness skills and ability to advocate for self. 14. Increase overall sense of well-being via reduction/elimination of anxiety. 15. Maintain a balance between the multiple demands of motherhood, work, and other life roles and responsibilities. 16. Reach a level of reduced tension, increased satisfaction, and improved communication with husband and/or other authority figures. 17. Reduce self-disparaging remarks and negative self-talk. 18. Resolve issues involving low  self-esteem or low self-efficacy that contribute to anxiety. Objective Verbalize the degree to which current life circumstances could be improved in areas such as work, home, and school. Target Date: 2023-10-02 Frequency: biweekly  Progress: 0 Modality: individual  Objective Establish a support system of trusting individuals in order to discuss life stressors and help reduce anxiety. Target Date: 2023-10-02 Frequency: biweekly  Progress: 0 Modality: individual  19. Restore normal eating patterns, healthy weight maintenance, and a realistic appraisal of body size. Objective Establish regular eating patterns by eating at regular intervals and consuming recommended daily calories. Target Date: 2023-10-02 Frequency: biweekly  Progress: 0 Modality: individual  Objective Honestly describe the pattern of eating including types, amounts, and frequency of food consumed. Target Date: 2023-10-02 Frequency: biweekly  Progress: 0 Modality: individual  Objective State a basis for positive identity that is not based on weight and appearance but on character, traits, relationships, and intrinsic value. Target Date: 2023-10-02 Frequency: biweekly  Progress: 0 Modality: individual  Objective Verbalize an accurate understanding of how eating disorders develop. Target Date: 2023-10-02 Frequency: biweekly  Progress: 0 Modality: individual  20. Terminate the pattern of binge eating behavior and return to normal eating of foods and establish healthy body weight. Objective Identify and strengthen ties to people and circumstances that affirm self-worth. Target Date: 2023-10-02 Frequency: biweekly  Progress: 0 Modality: individual  Objective Identify and change distorted self-talk messages associated with eating behavior. Target Date: 2023-10-02 Frequency: biweekly  Progress: 0 Modality: individual  Objective Verbalize acceptance of control, self-determination, and responsibility for one's body image and  eating behavior. Target Date: 2023-10-02 Frequency: biweekly  Progress: 0 Modality: individual  Objective Describe eating patterns, including frequency, amounts, and types of food consumed. Target Date: 2023-10-02 Frequency: biweekly  Progress: 0 Modality: individual  Objective Verbalize a more positive body image that is disconnected from rigid/narrow sociocultural messages. Target Date: 2023-10-02 Frequency: biweekly  Progress: 0 Modality: individual    Diagnosis:Adjustment disorder with anxiety  Plan:  -meet again on  Thursday, January 09, 2022 at 3pm.

## 2022-12-04 ENCOUNTER — Ambulatory Visit (INDEPENDENT_AMBULATORY_CARE_PROVIDER_SITE_OTHER): Payer: No Typology Code available for payment source | Admitting: Physician Assistant

## 2022-12-04 ENCOUNTER — Other Ambulatory Visit (HOSPITAL_COMMUNITY): Payer: Self-pay

## 2022-12-04 ENCOUNTER — Encounter: Payer: Self-pay | Admitting: Physician Assistant

## 2022-12-04 ENCOUNTER — Other Ambulatory Visit: Payer: Self-pay

## 2022-12-04 VITALS — BP 134/82 | HR 81 | Ht 63.5 in | Wt 216.0 lb

## 2022-12-04 DIAGNOSIS — J4521 Mild intermittent asthma with (acute) exacerbation: Secondary | ICD-10-CM | POA: Diagnosis not present

## 2022-12-04 MED ORDER — AZITHROMYCIN 250 MG PO TABS
ORAL_TABLET | ORAL | 0 refills | Status: AC
  Filled 2022-12-04 – 2022-12-10 (×6): qty 6, 5d supply, fill #0

## 2022-12-04 MED ORDER — PREDNISONE 50 MG PO TABS
50.0000 mg | ORAL_TABLET | Freq: Every day | ORAL | 0 refills | Status: DC
  Filled 2022-12-04: qty 5, 5d supply, fill #0

## 2022-12-04 NOTE — Progress Notes (Signed)
Acute Office Visit  Subjective:     Patient ID: Cathy Daniels, female    DOB: July 22, 1986, 36 y.o.   MRN: [id number]  Chief Complaint  Patient presents with   Cough    HPI Patient is in today for cough and chest tightness for over a week. Pt has asthma and taking advair. She is using her rescue inhaler with some relief. No fever, chills, body aches. Her cough is mildly productive. She is wheezing. She is taking OTC mucinex with little relief.   .. Active Ambulatory Problems    Diagnosis Date Noted   ALLERGIC RHINITIS CAUSE UNSPECIFIED 03/30/2010   Asthma, moderate persistent 04/19/2008   Vaginal delivery 12/17/2013   Hypothyroidism 01/28/2017   History of prediabetes 01/28/2017   History of gestational diabetes 01/28/2017   Irregular periods 01/28/2017   Eczema of both hands 01/28/2017   Situational anxiety 01/28/2017   History of asthma 01/28/2017   Menstrual cramp 01/28/2017   Vitamin D deficiency 02/03/2017   Diabetes mellitus affecting pregnancy in third trimester 12/22/2018   NSVD (normal spontaneous vaginal delivery) 12/23/2018   Pre-diabetes 06/03/2022   Iron deficiency anemia secondary to inadequate dietary iron intake 06/03/2022   Elevated blood pressure reading 06/03/2022   Newly recognized heart murmur 06/03/2022   Class 2 obesity due to excess calories without serious comorbidity with body mass index (BMI) of 39.0 to 39.9 in adult 06/03/2022   Low serum vitamin B12 06/04/2022   Stress 09/11/2022   Adjustment disorder with anxiety 09/19/2022   Resolved Ambulatory Problems    Diagnosis Date Noted   Rash and other nonspecific skin eruption 03/30/2010   Pregnancy 12/17/2013   Past Medical History:  Diagnosis Date   Abnormal pap 2007, 2008   Abnormal Pap smear    Anemia    Asthma    Genital warts    GERD (gastroesophageal reflux disease)    Gestational diabetes    Hypothyroid    Infection 2017   Pregnancy induced hypertension    Seasonal allergies     Vaginal Pap smear, abnormal      ROS  See HPI.     Objective:    BP 134/82   Pulse 81   Ht 5' 3.5" (1.613 m)   Wt 216 lb (98 kg)   SpO2 100%   BMI 37.66 kg/m  BP Readings from Last 3 Encounters:  12/04/22 134/82  09/11/22 (!) 149/93  06/14/22 130/90   Wt Readings from Last 3 Encounters:  12/04/22 216 lb (98 kg)  09/11/22 225 lb (102.1 kg)  06/14/22 235 lb (106.6 kg)      Physical Exam Constitutional:      Appearance: Normal appearance.  HENT:     Head: Normocephalic.     Right Ear: Tympanic membrane, ear canal and external ear normal.     Left Ear: Tympanic membrane, ear canal and external ear normal.     Nose: Nose normal.     Mouth/Throat:     Mouth: Mucous membranes are moist.     Pharynx: Posterior oropharyngeal erythema present. No oropharyngeal exudate.  Cardiovascular:     Rate and Rhythm: Normal rate.  Pulmonary:     Effort: Pulmonary effort is normal.     Breath sounds: Normal breath sounds.  Musculoskeletal:     Cervical back: Normal range of motion and neck supple.  Lymphadenopathy:     Cervical: No cervical adenopathy.  Neurological:     General: No focal deficit present.  Mental Status: She is alert.  Psychiatric:        Mood and Affect: Mood normal.          Assessment & Plan:  Marland KitchenMarland KitchenNajely was seen today for cough.  Diagnoses and all orders for this visit:  Mild intermittent asthmatic bronchitis with acute exacerbation -     predniSONE (DELTASONE) 50 MG tablet; Take one tablet daily for 5 days. -     azithromycin (ZITHROMAX Z-PAK) 250 MG tablet; Take 2 tablets (500 mg) on  Day 1,  followed by 1 tablet (250 mg) once daily on Days 2 through 5.   Sent prednisone to start if not improving or production worsening add zpak in next few days Continue albuterol inhaler as needed and advair daily.  Follow up as needed or if symptoms worsen or persist.   Iran Planas, [name]

## 2022-12-07 ENCOUNTER — Other Ambulatory Visit (HOSPITAL_COMMUNITY): Payer: Self-pay

## 2022-12-09 ENCOUNTER — Other Ambulatory Visit (HOSPITAL_COMMUNITY): Payer: Self-pay

## 2022-12-09 ENCOUNTER — Other Ambulatory Visit: Payer: Self-pay

## 2022-12-10 ENCOUNTER — Other Ambulatory Visit (HOSPITAL_COMMUNITY): Payer: Self-pay

## 2022-12-10 ENCOUNTER — Other Ambulatory Visit: Payer: Self-pay

## 2022-12-11 ENCOUNTER — Other Ambulatory Visit (HOSPITAL_COMMUNITY): Payer: Self-pay

## 2022-12-11 ENCOUNTER — Ambulatory Visit: Payer: No Typology Code available for payment source | Admitting: Professional

## 2022-12-11 ENCOUNTER — Other Ambulatory Visit: Payer: Self-pay

## 2022-12-11 ENCOUNTER — Other Ambulatory Visit (HOSPITAL_BASED_OUTPATIENT_CLINIC_OR_DEPARTMENT_OTHER): Payer: Self-pay

## 2022-12-12 ENCOUNTER — Other Ambulatory Visit (HOSPITAL_COMMUNITY): Payer: Self-pay

## 2022-12-12 ENCOUNTER — Other Ambulatory Visit: Payer: Self-pay

## 2022-12-12 ENCOUNTER — Other Ambulatory Visit (HOSPITAL_BASED_OUTPATIENT_CLINIC_OR_DEPARTMENT_OTHER): Payer: Self-pay

## 2022-12-19 LAB — HM DIABETES EYE EXAM

## 2022-12-24 ENCOUNTER — Telehealth: Payer: Self-pay

## 2022-12-24 ENCOUNTER — Other Ambulatory Visit (HOSPITAL_COMMUNITY): Payer: Self-pay

## 2022-12-24 NOTE — Telephone Encounter (Signed)
Initiated Prior authorization PPI:RJJOAC 2.4MG /0.75ML auto-injectors Via: Called [employer] faxed documents Case/Key:[id number] Status: approved  as of 12/24/21 Reason:This Authroaztion is valid through1/2/24-6/30/24 Notified Pt via: Mychart

## 2022-12-25 ENCOUNTER — Other Ambulatory Visit (HOSPITAL_COMMUNITY): Payer: Self-pay

## 2023-01-09 ENCOUNTER — Ambulatory Visit: Payer: Self-pay | Admitting: Professional

## 2023-01-16 ENCOUNTER — Encounter: Payer: Self-pay | Admitting: Physician Assistant

## 2023-01-17 ENCOUNTER — Other Ambulatory Visit: Payer: Self-pay

## 2023-01-21 ENCOUNTER — Encounter (HOSPITAL_BASED_OUTPATIENT_CLINIC_OR_DEPARTMENT_OTHER): Payer: Self-pay | Admitting: Obstetrics & Gynecology

## 2023-01-21 ENCOUNTER — Other Ambulatory Visit (HOSPITAL_COMMUNITY)
Admission: RE | Admit: 2023-01-21 | Discharge: 2023-01-21 | Disposition: A | Discharge status: Home / Self Care | Payer: 59 | Source: Ambulatory Visit | Attending: Obstetrics & Gynecology | Admitting: Obstetrics & Gynecology

## 2023-01-21 ENCOUNTER — Ambulatory Visit (HOSPITAL_BASED_OUTPATIENT_CLINIC_OR_DEPARTMENT_OTHER): Payer: 59 | Admitting: Obstetrics & Gynecology

## 2023-01-21 VITALS — BP 128/82 | HR 81 | Ht 63.5 in | Wt 214.6 lb

## 2023-01-21 DIAGNOSIS — Z87898 Personal history of other specified conditions: Secondary | ICD-10-CM

## 2023-01-21 DIAGNOSIS — D508 Other iron deficiency anemias: Secondary | ICD-10-CM

## 2023-01-21 DIAGNOSIS — Z124 Encounter for screening for malignant neoplasm of cervix: Secondary | ICD-10-CM | POA: Insufficient documentation

## 2023-01-21 DIAGNOSIS — Z01419 Encounter for gynecological examination (general) (routine) without abnormal findings: Secondary | ICD-10-CM

## 2023-01-21 DIAGNOSIS — N393 Stress incontinence (female) (male): Secondary | ICD-10-CM | POA: Diagnosis not present

## 2023-01-21 DIAGNOSIS — N920 Excessive and frequent menstruation with regular cycle: Secondary | ICD-10-CM

## 2023-01-21 DIAGNOSIS — E039 Hypothyroidism, unspecified: Secondary | ICD-10-CM | POA: Diagnosis not present

## 2023-01-21 MED ORDER — TRANEXAMIC ACID 650 MG PO TABS
1300.0000 mg | ORAL_TABLET | Freq: Three times a day (TID) | ORAL | 3 refills | Status: DC
  Filled 2023-01-21: qty 30, 5d supply, fill #0

## 2023-01-21 NOTE — Progress Notes (Signed)
37 y.o. [id number] Married Other or two or more races female here for annual exam.  Finishing DNP program in May.  She is at [employer].  Working on clinical hours.  She wants to do primary care.    Cycles are regular and heavy with dysmenorrhea.  Flow lasts 3-4 days.  The first two days are heavy with some clotting.  Uses overnight pads with wings and has to change every two to three hours.    Does have iron deficiency and hx of anemia.    Has some urinary incontinence.  Discussed pelvic PT.    Patient's last menstrual period was 01/14/2023 (approximate).          Sexually active: Yes.    The current method of family planning is condoms always.    Smoker:  no  Health Maintenance: Pap:  obtained today History of abnormal Pap:  remote hx MMG:  guidelines reviewed Colonoscopy:  guidelines reviewed Screening Labs: done June and September 2023   reports that she has never smoked. She has never used smokeless tobacco. She reports current alcohol use. She reports that she does not use drugs.  Past Medical History:  Diagnosis Date   Abnormal pap 2007, 2008   colpo x 3   Anemia    Asthma    smoke induced, no inhaler used in  over a year   Genital warts    GERD (gastroesophageal reflux disease)    Gestational diabetes    Hypothyroid    Infection 2017   UTI   Pregnancy induced hypertension    Seasonal allergies    Vaginal Pap smear, abnormal    had colpo- normal since    Past Surgical History:  Procedure Laterality Date   COLPOSCOPY  3/07, 8/07, 1/08   CIN II, benign, CIN I   WISDOM TOOTH EXTRACTION      Current Outpatient Medications  Medication Sig Dispense Refill   fluticasone-salmeterol (ADVAIR DISKUS) 250-50 MCG/ACT AEPB Inhale 1 puff into the lungs in the morning and at bedtime. 60 each 3   ibuprofen (ADVIL,MOTRIN) 600 MG tablet Take 1 tablet (600 mg total) by mouth every 6 (six) hours as needed for cramping. 40 tablet 1   levothyroxine (SYNTHROID) 112 MCG tablet TAKE 1 TABLET  BY MOUTH EVERY MORNING ON AN EMPTY STOMACH 90 tablet 3   tranexamic acid (LYSTEDA) 650 MG TABS tablet Take 2 tablets (1,300 mg total) by mouth 3 (three) times daily. 30 tablet 3   WEGOVY 2.4 MG/0.75ML SOAJ Inject 2.4 mg into the skin once a week. 3 mL 5   No current facility-administered medications for this visit.    Family History  Problem Relation Age of Onset   Diabetes Father    Hypertension Mother    Diabetes Other        PU x 5, PA x 1   Glaucoma Maternal Grandmother    Hypertension Maternal Grandfather    Hearing loss Neg Hx     ROS: Constitutional: negative Genitourinary:negative  Exam:   BP 128/82   Pulse 81   Ht 5' 3.5" (1.613 m) Comment: Reported  Wt 214 lb 9.6 oz (97.3 kg)   LMP 01/14/2023 (Approximate)   BMI 37.42 kg/m   Height: 5' 3.5" (161.3 cm) (Reported)  General appearance: alert, cooperative and appears stated age Head: Normocephalic, without obvious abnormality, atraumatic Neck: no adenopathy, supple, symmetrical, trachea midline and thyroid normal to inspection and palpation Lungs: clear to auscultation bilaterally Breasts: normal appearance, no masses or tenderness  Heart: regular rate and rhythm Abdomen: soft, non-tender; bowel sounds normal; no masses,  no organomegaly Extremities: extremities normal, atraumatic, no cyanosis or edema Skin: Skin color, texture, turgor normal. No rashes or lesions Lymph nodes: Cervical, supraclavicular, and axillary nodes normal. No abnormal inguinal nodes palpated Neurologic: Grossly normal   Pelvic: External genitalia:  no lesions              Urethra:  normal appearing urethra with no masses, tenderness or lesions              Bartholins and Skenes: normal                 Vagina: normal appearing vagina with normal color and no discharge, no lesions              Cervix: no lesions              Pap taken: Yes.   Bimanual Exam:  Uterus:  normal size, contour, position, consistency, mobility, non-tender               Adnexa: normal adnexa               Rectovaginal: Confirms               Anus:  normal sphincter tone, no lesions  Chaperone, Octaviano Batty, CMA, was present for exam.  Assessment/Plan: 1. Well woman exam with routine gynecological exam - Pap smear obtained today - Mammogram guidelines reviewed - Colonoscopy guidelines reviewed - lab work done with PCP in June and September - vaccines reviewed/updated  2. Cervical cancer screening - Cytology - PAP( [facility])  3. History of prediabetes  4. Iron deficiency anemia secondary to inadequate dietary iron intake - IV iron discussed.  Pt is taking oral and level has not significantly changed.  5. Acquired hypothyroidism - on replacement  6. Stress incontinence of urine - kegels, pelvic PT and Urogyn evaluation discussed  7. Menorrhagia with regular cycle - will start Lysteda

## 2023-01-22 ENCOUNTER — Other Ambulatory Visit: Payer: Self-pay

## 2023-01-22 ENCOUNTER — Other Ambulatory Visit (HOSPITAL_COMMUNITY): Payer: Self-pay

## 2023-01-23 ENCOUNTER — Ambulatory Visit (INDEPENDENT_AMBULATORY_CARE_PROVIDER_SITE_OTHER): Payer: Self-pay | Admitting: Professional

## 2023-01-23 ENCOUNTER — Encounter: Payer: Self-pay | Admitting: Professional

## 2023-01-23 DIAGNOSIS — F4322 Adjustment disorder with anxiety: Secondary | ICD-10-CM

## 2023-01-23 NOTE — Progress Notes (Signed)
[facility] Counselor/Therapist Progress Note  Patient ID: Cathy Daniels, MRN: [id number],    Date: 01/23/2023  Time Spent: 8 minutes [id number]   Treatment Type: Individual Therapy  Risk Assessment: Danger to Self:  No Self-injurious Behavior: No Danger to Others: No  Subjective: This session was held via video teletherapy. The patient consented to video teletherapy and was located in her car during this session. She is aware it is the responsibility of the patient to secure confidentiality on her end of the session. The provider was in a private home office for the duration of this session.    The patient arrived on time for her webex appointment.  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  Treatment Plan Problems Addressed  Anxiety, Balancing Work and Family/Multiple Roles, Body Image Disturbance/Eating Disorders, Low Self-Esteem/Lack of Assertiveness  Goals 1. Address and eliminate maladaptive thought processes that lead to anxious responses. 2. Change the definition of self to encompass positive attributes beyond body weight, size, and shape. 3. Decrease dependence on relationships while beginning to meet own needs, build confidence, and practice assertiveness. Objective Increase saying no to others' requests. Target Date: 2023-10-02 Frequency: biweekly  Progress: 0 Modality: individual  Objective Identify own emotional and social needs and ways to fulfill them. Target Date: 2023-10-02 Frequency: biweekly  Progress: 0 Modality: individual  4. Develop a support system to assist with multiple roles and responsibilities. Objective Implement assertiveness skills and limit setting. Target Date: 2023-10-02 Frequency: biweekly  Progress: 0 Modality: individual  Objective Clarify values and  priorities. Target Date: 2023-10-02 Frequency: biweekly  Progress: 0 Modality: individual  Objective Identify at least five practical ways to manage stress associated with multiple demands. Target Date: 2023-10-02 Frequency: biweekly  Progress: 0 Modality: individual  Objective Commit to a healthy eating, sleeping, and exercise routine. Target Date: 2023-10-02 Frequency: biweekly  Progress: 0 Modality: individual  Objective Describe role and responsibilities associated with work and family and related thoughts, feelings, and behaviors. Target Date: 2023-10-02 Frequency: biweekly  Progress: 0 Modality: individual  Objective Learn and implement stress management and relaxation techniques to reduce fatigue, anxiety, and depressive symptoms. Target Date: 2023-10-02 Frequency: biweekly  Progress: 0 Modality: individual  Objective Generate a list of self-care activities and make a commitment to regularly participate in such activities. Target Date: 2023-10-02 Frequency: biweekly  Progress: 0 Modality: individual  5. Develop an awareness of internalized unrealistic and narrow standards of women's beauty and learn to challenge such beliefs. 6. Develop coping strategies (e.g., feeling identification, problem-solving, assertiveness) to address emotional issues that could lead to relapse of the eating disorder. 7. Develop healthy cognitive patterns and beliefs about self that lead to positive identity and prevent a relapse of the eating disorder. 8. Develop time management strategies to reduce role overload and role conflicts. 9. Eliminate depressive and anxiety symptoms associated with trying to balance multiple roles. 10. Enhance ability to handle effectively life stressors. 11. Improve self-esteem and develop a positive self-image as capable and competent. 12. Increase ability to express needs and desires openly and honestly. Objective Acknowledge self-disparaging statements and recognize the  tendency to engage in such statements. Target Date: 2023-10-02 Frequency: biweekly  Progress: 0 Modality: individual  Objective Practice saying "no" to at least one person, declining to comply with a request/favor or identify and assert rights, needs, and wants. Target Date: 2023-10-02 Frequency: biweekly  Progress: 0 Modality: individual  Objective Verbalize an understanding of the differences between passive, assertive, and aggressive behavior. Target Date: 2023-10-02 Frequency: biweekly  Progress: 0 Modality: individual  13. Increase assertiveness skills and ability to advocate for self. 14. Increase overall sense of well-being via reduction/elimination of anxiety. 15. Maintain a balance between the multiple demands of motherhood, work, and other life roles and responsibilities. 16. Reach a level of reduced tension, increased satisfaction, and improved communication with husband and/or other authority figures. 17. Reduce self-disparaging remarks and negative self-talk. 18. Resolve issues involving low self-esteem or low self-efficacy that contribute to anxiety. Objective Verbalize the degree to which current life circumstances could be improved in areas such as work, home, and school. Target Date: 2023-10-02 Frequency: biweekly  Progress: 0 Modality: individual  Objective Establish a support system of trusting individuals in order to discuss life stressors and help reduce anxiety. Target Date: 2023-10-02 Frequency: biweekly  Progress: 0 Modality: individual  19. Restore normal eating patterns, healthy weight maintenance, and a realistic appraisal of body size. Objective Establish regular eating patterns by eating at regular intervals and consuming recommended daily calories. Target Date: 2023-10-02 Frequency: biweekly  Progress: 0 Modality: individual  Objective Honestly describe the pattern of eating including types, amounts, and frequency of food consumed. Target Date: 2023-10-02  Frequency: biweekly  Progress: 0 Modality: individual  Objective State a basis for positive identity that is not based on weight and appearance but on character, traits, relationships, and intrinsic value. Target Date: 2023-10-02 Frequency: biweekly  Progress: 0 Modality: individual  Objective Verbalize an accurate understanding of how eating disorders develop. Target Date: 2023-10-02 Frequency: biweekly  Progress: 0 Modality: individual  20. Terminate the pattern of binge eating behavior and return to normal eating of foods and establish healthy body weight. Objective Identify and strengthen ties to people and circumstances that affirm self-worth. Target Date: 2023-10-02 Frequency: biweekly  Progress: 0 Modality: individual  Objective Identify and change distorted self-talk messages associated with eating behavior. Target Date: 2023-10-02 Frequency: biweekly  Progress: 0 Modality: individual  Objective Verbalize acceptance of control, self-determination, and responsibility for one's body image and eating behavior. Target Date: 2023-10-02 Frequency: biweekly  Progress: 0 Modality: individual  Objective Describe eating patterns, including frequency, amounts, and types of food consumed. Target Date: 2023-10-02 Frequency: biweekly  Progress: 0 Modality: individual  Objective Verbalize a more positive body image that is disconnected from rigid/narrow sociocultural messages. Target Date: 2023-10-02 Frequency: biweekly  Progress: 0 Modality: individual    Diagnosis:Adjustment disorder with anxiety  Plan:  -Unbillable visit due to length of session. -meet again on Thursday, March 7,2024 at 12pm in-person.

## 2023-01-27 LAB — CYTOLOGY - PAP
Comment: NEGATIVE
Diagnosis: NEGATIVE
Diagnosis: REACTIVE
High risk HPV: NEGATIVE

## 2023-02-13 ENCOUNTER — Other Ambulatory Visit: Payer: Self-pay

## 2023-02-21 ENCOUNTER — Other Ambulatory Visit: Payer: Self-pay | Admitting: Neurology

## 2023-02-21 ENCOUNTER — Other Ambulatory Visit (HOSPITAL_COMMUNITY): Payer: Self-pay

## 2023-02-21 DIAGNOSIS — E6609 Other obesity due to excess calories: Secondary | ICD-10-CM

## 2023-02-21 MED ORDER — WEGOVY 2.4 MG/0.75ML ~~LOC~~ SOAJ
2.4000 mg | SUBCUTANEOUS | 0 refills | Status: DC
  Filled 2023-02-21 – 2023-03-10 (×4): qty 3, 28d supply, fill #0
  Filled 2023-03-30 – 2023-04-11 (×4): qty 3, 28d supply, fill #1
  Filled 2023-05-05 (×2): qty 3, 28d supply, fill #2

## 2023-02-22 ENCOUNTER — Other Ambulatory Visit (HOSPITAL_COMMUNITY): Payer: Self-pay

## 2023-02-26 ENCOUNTER — Other Ambulatory Visit (HOSPITAL_COMMUNITY): Payer: Self-pay

## 2023-02-27 ENCOUNTER — Ambulatory Visit: Payer: 59 | Admitting: Professional

## 2023-03-04 ENCOUNTER — Other Ambulatory Visit (HOSPITAL_COMMUNITY): Payer: Self-pay

## 2023-03-04 ENCOUNTER — Other Ambulatory Visit: Payer: Self-pay

## 2023-03-10 ENCOUNTER — Other Ambulatory Visit (HOSPITAL_COMMUNITY): Payer: Self-pay

## 2023-03-10 ENCOUNTER — Other Ambulatory Visit: Payer: Self-pay

## 2023-03-11 ENCOUNTER — Other Ambulatory Visit: Payer: Self-pay

## 2023-03-13 ENCOUNTER — Ambulatory Visit (INDEPENDENT_AMBULATORY_CARE_PROVIDER_SITE_OTHER): Payer: 59 | Admitting: Family Medicine

## 2023-03-13 VITALS — Temp 98.6°F

## 2023-03-13 DIAGNOSIS — J029 Acute pharyngitis, unspecified: Secondary | ICD-10-CM | POA: Diagnosis not present

## 2023-03-13 LAB — POCT RAPID STREP A (OFFICE): Rapid Strep A Screen: NEGATIVE

## 2023-03-13 NOTE — Progress Notes (Signed)
   Acute Office Visit  Subjective:     Patient ID: Cathy Daniels, female    DOB: 10-16-1986, 37 y.o.   MRN: [id number]  Chief Complaint  Patient presents with   Sore Throat    HPI Patient is in today for sore throat.  Yesterday evening started just feeling really tired rundown and developed a sore throat and some left ear pain.  She does work in a [employer].  ROS      Objective:    Temp 98.6 F (37 C)    Physical Exam Constitutional:      Appearance: She is well-developed.  HENT:     Head: Normocephalic and atraumatic.     Right Ear: Tympanic membrane, ear canal and external ear normal.     Left Ear: Tympanic membrane, ear canal and external ear normal.     Nose: Nose normal.     Mouth/Throat:     Pharynx: Posterior oropharyngeal erythema present. No oropharyngeal exudate.  Eyes:     Conjunctiva/sclera: Conjunctivae normal.  Pulmonary:     Effort: Pulmonary effort is normal.  Skin:    General: Skin is warm and dry.  Neurological:     Mental Status: She is alert and oriented to person, place, and time.     Results for orders placed or performed in visit on 03/13/23  POCT rapid strep A  Result Value Ref Range   Rapid Strep A Screen Negative Negative        Assessment & Plan:   Problem List Items Addressed This Visit   None Visit Diagnoses     Sore throat    -  Primary   Relevant Orders   POCT rapid strep A (Completed)   Culture, Group A Strep      Rapid strep negative.  Most consistent with viral pharyngitis.  Recommend conservative care with anti-inflammatory, Tylenol, salt water gargles.  If worsening or develops an exudate then please let us know.  Culture sent for further workup.  No orders of the defined types were placed in this encounter.   No follow-ups on file.  Beatrice Lecher, MD

## 2023-03-15 LAB — CULTURE, GROUP A STREP
MICRO NUMBER:: 14723885
SPECIMEN QUALITY:: ADEQUATE

## 2023-03-17 NOTE — Progress Notes (Signed)
Wonderful, no strep throat!  Hopefully you are feeling much better.

## 2023-03-27 ENCOUNTER — Ambulatory Visit: Payer: 59 | Admitting: Professional

## 2023-03-31 ENCOUNTER — Other Ambulatory Visit: Payer: Self-pay

## 2023-04-07 ENCOUNTER — Other Ambulatory Visit: Payer: Self-pay

## 2023-04-11 ENCOUNTER — Other Ambulatory Visit (HOSPITAL_BASED_OUTPATIENT_CLINIC_OR_DEPARTMENT_OTHER): Payer: Self-pay

## 2023-04-11 ENCOUNTER — Encounter: Payer: Self-pay | Admitting: Physician Assistant

## 2023-04-11 ENCOUNTER — Other Ambulatory Visit (HOSPITAL_COMMUNITY): Payer: Self-pay

## 2023-04-11 ENCOUNTER — Ambulatory Visit (INDEPENDENT_AMBULATORY_CARE_PROVIDER_SITE_OTHER): Payer: 59 | Admitting: Physician Assistant

## 2023-04-11 VITALS — BP 132/86 | HR 84 | Temp 97.6°F | Wt 202.0 lb

## 2023-04-11 DIAGNOSIS — H938X3 Other specified disorders of ear, bilateral: Secondary | ICD-10-CM | POA: Diagnosis not present

## 2023-04-11 DIAGNOSIS — J302 Other seasonal allergic rhinitis: Secondary | ICD-10-CM | POA: Diagnosis not present

## 2023-04-11 DIAGNOSIS — H6993 Unspecified Eustachian tube disorder, bilateral: Secondary | ICD-10-CM

## 2023-04-11 DIAGNOSIS — H9313 Tinnitus, bilateral: Secondary | ICD-10-CM | POA: Insufficient documentation

## 2023-04-11 IMAGING — US US THYROID
1 series · 14 of 25 positions shown · non-contrast
Comparison: None.

CLINICAL DATA: Thyroid enlargement

EXAM:
THYROID ULTRASOUND
TECHNIQUE: Ultrasound examination of the thyroid gland and adjacent soft
tissues was performed.

[Series 1: us thyroid · 14 of 27 slices shown]
[im 1/27]
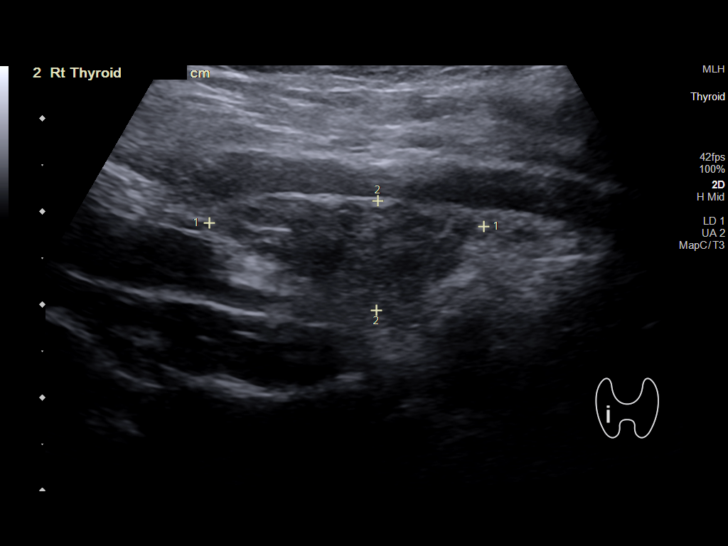
[im 3/27]
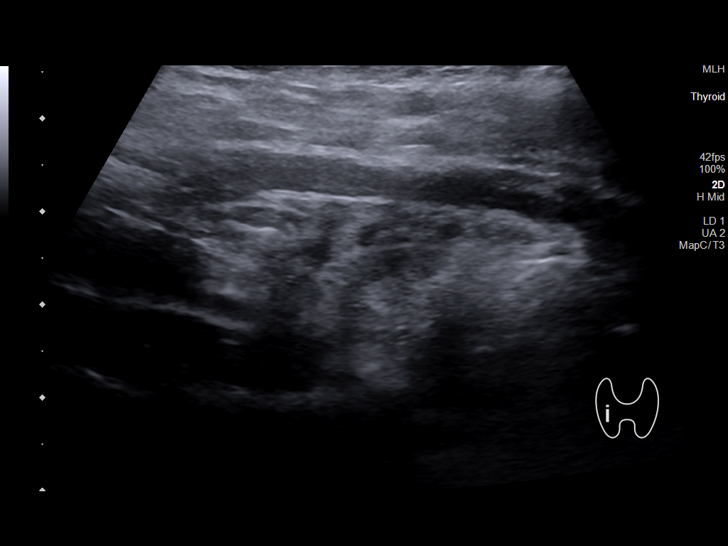
[im 5/27]
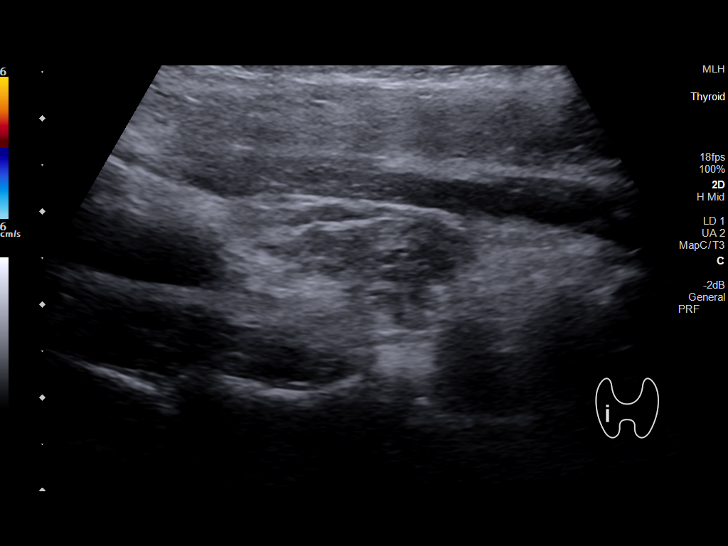
[im 7/27]
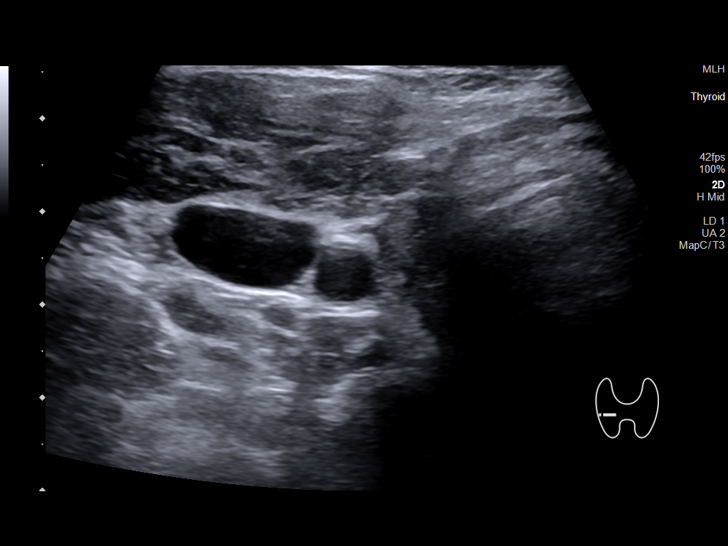
[im 9/27]
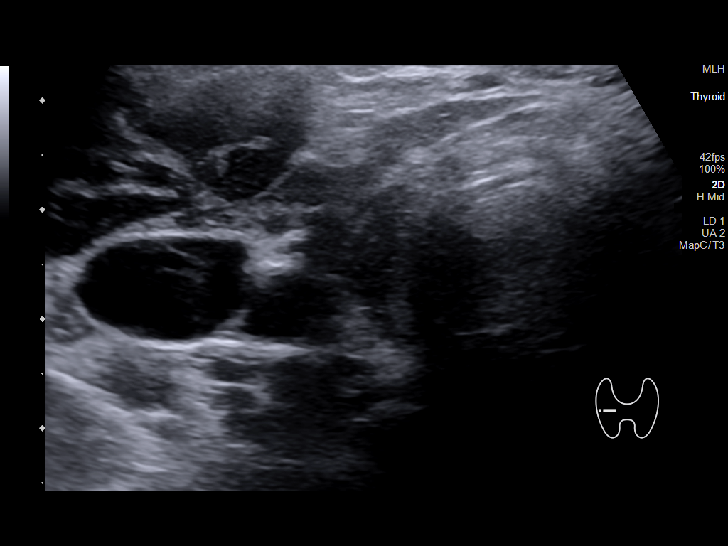
[im 10/27]
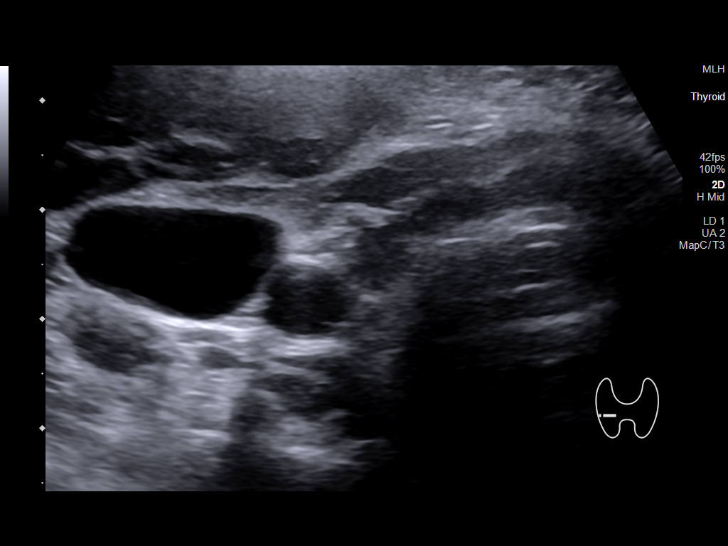
[im 12/27]
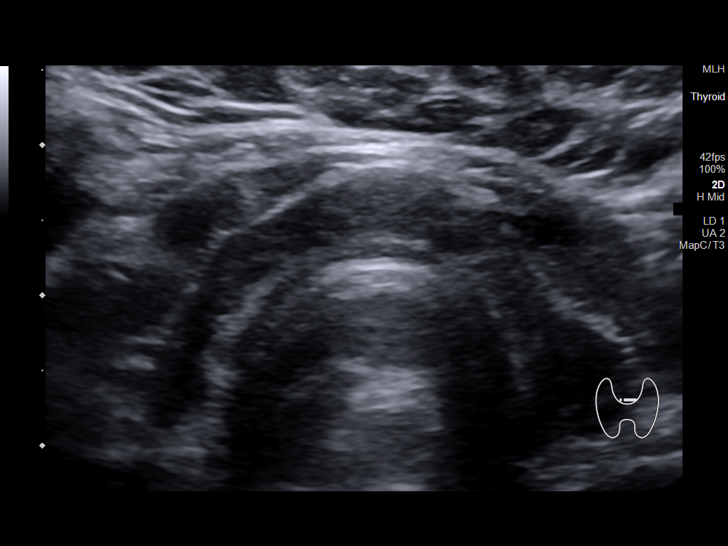
[im 15/27]
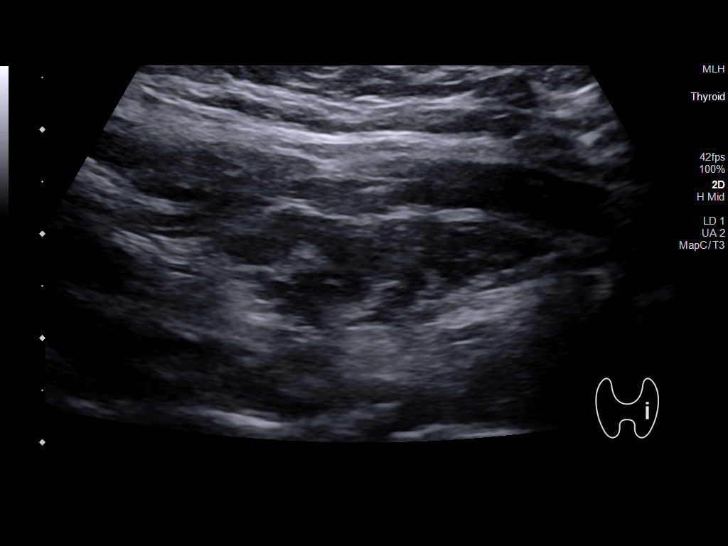
[im 17/27]
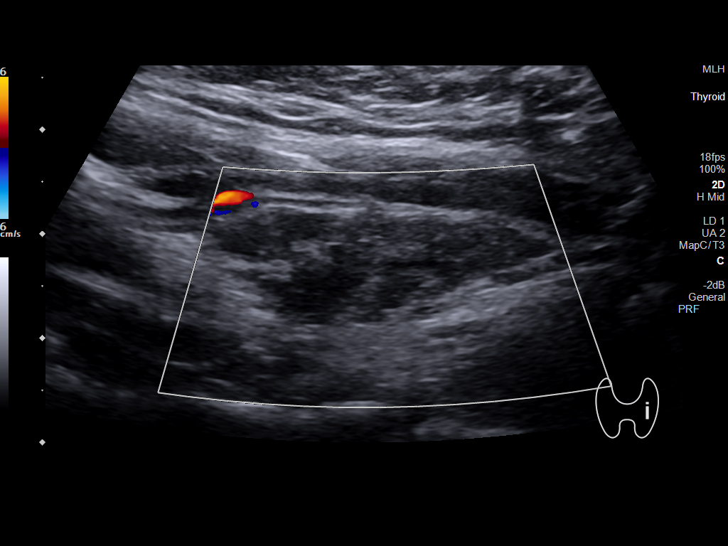
[im 18/27]
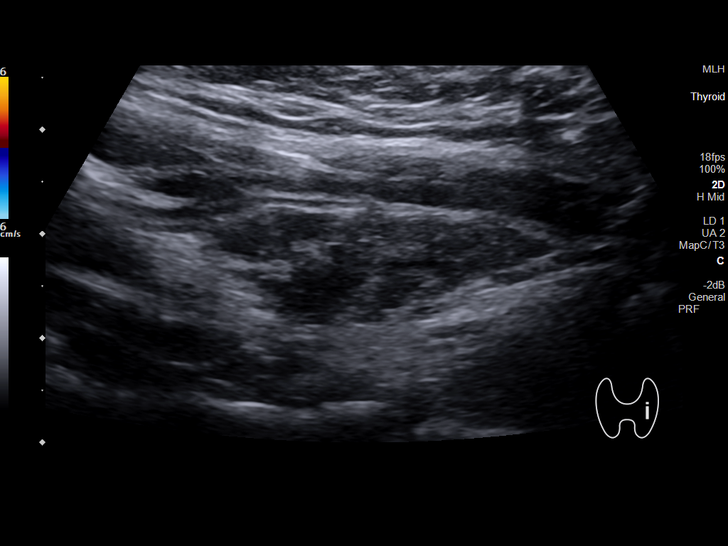
[im 20/27]
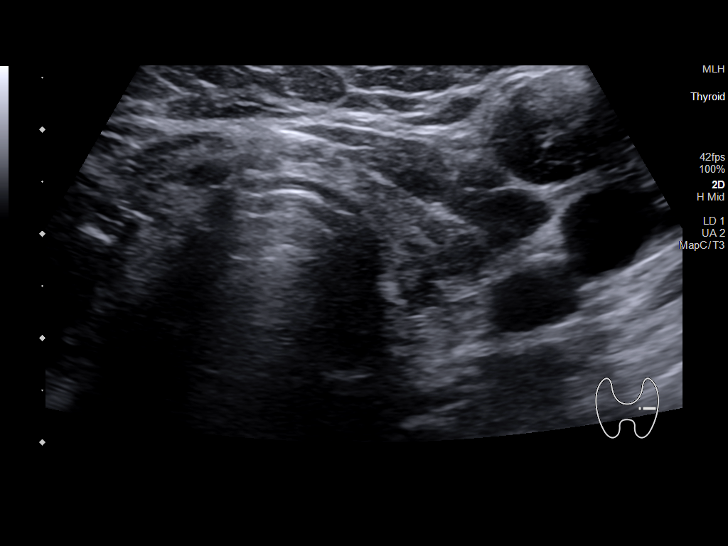
[im 22/27]
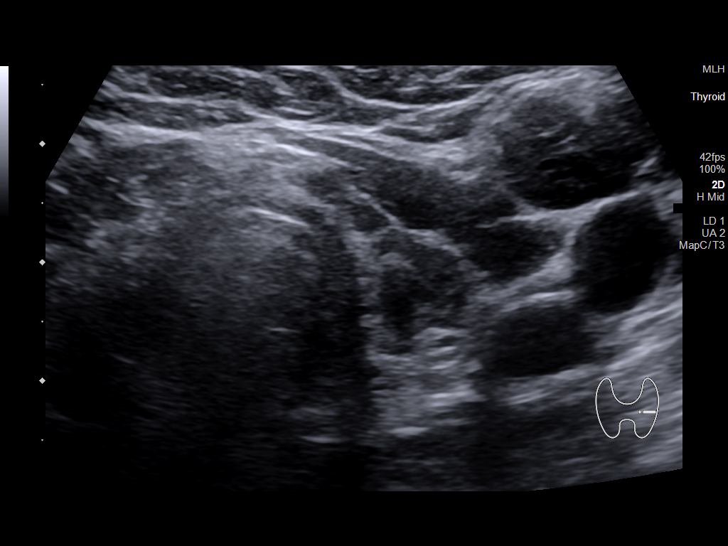
[im 24/27]
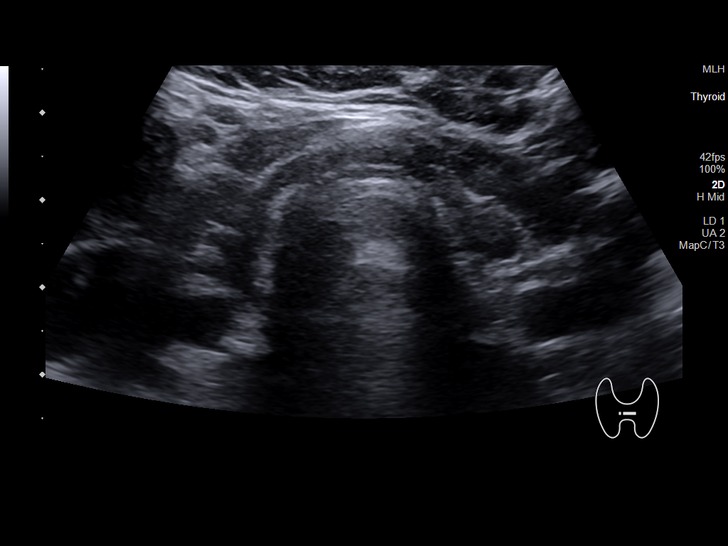
[im 27/27]
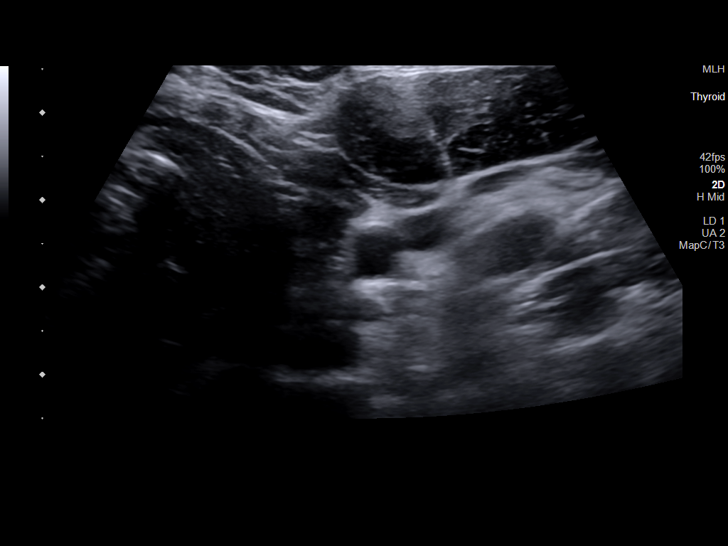

[14 of 25 positions shown; findings below may reference images not displayed]

FINDINGS: Parenchymal Echotexture: Moderately heterogenous

Isthmus: 5 mm

Right lobe: 3.0 x 1.2 x 0.9 cm

Left lobe: 3.2 x 1.1 x 0.8 cm

_________________________________________________________

Estimated total number of nodules >/= 1 cm: 0

Number of spongiform nodules >/=  2 cm not described below (TR1): 0

Number of mixed cystic and solid nodules >/= 1.5 cm not described
below (TR2): 0

_________________________________________________________

Moderate thyroid heterogeneity and diffuse atrophy compatible with
chronic medical thyroid disease. No discrete nodule or focal
abnormality. No hypervascularity or regional adenopathy.
IMPRESSION: Heterogeneous atrophic thyroid compatible with medical thyroid
disease.

Negative for nodule.

The above is in keeping with the ACR TI-RADS recommendations - [HOSPITAL] 4947;[DATE].

## 2023-04-11 MED ORDER — METHYLPREDNISOLONE SODIUM SUCC 125 MG IJ SOLR
125.0000 mg | Freq: Once | INTRAMUSCULAR | Status: AC
Start: 2023-04-11 — End: 2023-04-11
  Administered 2023-04-11: 125 mg via INTRAMUSCULAR

## 2023-04-11 MED ORDER — FLUTICASONE PROPIONATE 50 MCG/ACT NA SUSP
2.0000 | Freq: Every day | NASAL | 2 refills | Status: AC
Start: 2023-04-11 — End: ?
  Filled 2023-04-11: qty 16, 30d supply, fill #0

## 2023-04-11 NOTE — Progress Notes (Signed)
Acute Office Visit  Subjective:     Patient ID: Cathy Daniels, female    DOB: 07-22-1986, 37 y.o.   MRN: [id number]  Chief Complaint  Patient presents with   Ear Pain    HPI Patient is in today for bilateral ears "buzzing" and intermittent sharp pains for 2 days. She admits to some allergy symptoms such as PND and sneezing. No fever, chills, ST, sinus pressure, headache. She started claritin and her right ear has improved but left ear is still buzzing. Not checked BP.   .. Active Ambulatory Problems    Diagnosis Date Noted   ALLERGIC RHINITIS CAUSE UNSPECIFIED 03/30/2010   Asthma, moderate persistent 04/19/2008   Vaginal delivery 12/17/2013   Hypothyroidism 01/28/2017   History of prediabetes 01/28/2017   History of gestational diabetes 01/28/2017   Irregular periods 01/28/2017   Eczema of both hands 01/28/2017   Situational anxiety 01/28/2017   History of asthma 01/28/2017   Menstrual cramp 01/28/2017   Vitamin D deficiency 02/03/2017   Diabetes mellitus affecting pregnancy in third trimester 12/22/2018   NSVD (normal spontaneous vaginal delivery) 12/23/2018   Pre-diabetes 06/03/2022   Iron deficiency anemia secondary to inadequate dietary iron intake 06/03/2022   Elevated blood pressure reading 06/03/2022   Newly recognized heart murmur 06/03/2022   Class 2 obesity due to excess calories without serious comorbidity with body mass index (BMI) of 39.0 to 39.9 in adult 06/03/2022   Low serum vitamin B12 06/04/2022   Stress 09/11/2022   Adjustment disorder with anxiety 09/19/2022   Ear fullness, bilateral 04/11/2023   Tinnitus of both ears 04/11/2023   Seasonal allergies 04/11/2023   ETD (Eustachian tube dysfunction), bilateral 04/11/2023   Resolved Ambulatory Problems    Diagnosis Date Noted   Rash and other nonspecific skin eruption 03/30/2010   Pregnancy 12/17/2013   Past Medical History:  Diagnosis Date   Abnormal pap 2007, 2008   Anemia    Asthma    Genital  warts    GERD (gastroesophageal reflux disease)    Gestational diabetes    Hypothyroid    Infection 2017   Pregnancy induced hypertension    Vaginal Pap smear, abnormal      ROS  See HPI>     Objective:    BP 132/86   Pulse 84   Temp 97.6 F (36.4 C) (Oral)   Wt 202 lb (91.6 kg)   BMI 35.22 kg/m  BP Readings from Last 3 Encounters:  04/11/23 132/86  01/21/23 128/82  12/04/22 134/82   Wt Readings from Last 3 Encounters:  04/11/23 202 lb (91.6 kg)  01/21/23 214 lb 9.6 oz (97.3 kg)  12/04/22 216 lb (98 kg)      Physical Exam Constitutional:      Appearance: Normal appearance.  HENT:     Head: Normocephalic.     Right Ear: Ear canal and external ear normal. There is no impacted cerumen.     Left Ear: Ear canal and external ear normal. There is no impacted cerumen.     Ears:     Comments: Bilateral TM clear with good light reflex and no erythema they do appear to be bulging slightly and some opaque appearance at the base from 3 to 6 oclock.     Nose:     Comments: Bilateral turbinates red and swollen    Mouth/Throat:     Mouth: Mucous membranes are moist.     Comments: Some PND with normal oropharynx Eyes:  Conjunctiva/sclera: Conjunctivae normal.  Neck:     Vascular: No carotid bruit.  Cardiovascular:     Rate and Rhythm: Normal rate.  Pulmonary:     Effort: Pulmonary effort is normal.     Breath sounds: Normal breath sounds.  Musculoskeletal:     Cervical back: Normal range of motion and neck supple. No rigidity or tenderness.  Lymphadenopathy:     Cervical: No cervical adenopathy.  Neurological:     Mental Status: She is alert.  Psychiatric:        Mood and Affect: Mood normal.         Assessment & Plan:  Cathy Daniels KitchenMarland KitchenArietta was seen today for ear pain.  Diagnoses and all orders for this visit:  ETD (Eustachian tube dysfunction), bilateral -     fluticasone (FLONASE) 50 MCG/ACT nasal spray; Place 2 sprays into both nostrils daily. -      methylPREDNISolone sodium succinate (SOLU-MEDROL) 125 mg/2 mL injection 125 mg  Ear fullness, bilateral -     fluticasone (FLONASE) 50 MCG/ACT nasal spray; Place 2 sprays into both nostrils daily. -     methylPREDNISolone sodium succinate (SOLU-MEDROL) 125 mg/2 mL injection 125 mg  Tinnitus of both ears -     fluticasone (FLONASE) 50 MCG/ACT nasal spray; Place 2 sprays into both nostrils daily. -     methylPREDNISolone sodium succinate (SOLU-MEDROL) 125 mg/2 mL injection 125 mg  Seasonal allergies -     fluticasone (FLONASE) 50 MCG/ACT nasal spray; Place 2 sprays into both nostrils daily. -     methylPREDNISolone sodium succinate (SOLU-MEDROL) 125 mg/2 mL injection 125 mg   Reassured patient no signs of infection only inflammation and bulging of TM Vitals look good Solumedrol given today Continue claritin and start flonase Ok to take some sudafed as well Vitals look good today Follow up as needed or if symptoms worsen  Return if symptoms worsen or fail to improve.  Tandy Gaw, [name]

## 2023-04-11 NOTE — Patient Instructions (Signed)
Eustachian Tube Dysfunction  Eustachian tube dysfunction refers to a condition in which a blockage develops in the narrow passage that connects the middle ear to the back of the nose (eustachian tube). The eustachian tube regulates air pressure in the middle ear by letting air move between the ear and nose. It also helps to drain fluid from the middle ear space. Eustachian tube dysfunction can affect one or both ears. When the eustachian tube does not function properly, air pressure, fluid, or both can build up in the middle ear. What are the causes? This condition occurs when the eustachian tube becomes blocked or cannot open normally. Common causes of this condition include: Ear infections. Colds and other infections that affect the nose, mouth, and throat (upper respiratory tract). Allergies. Irritation from cigarette smoke. Irritation from stomach acid coming up into the esophagus (gastroesophageal reflux). The esophagus is the part of the body that moves food from the mouth to the stomach. Sudden changes in air pressure, such as from descending in an airplane or scuba diving. Abnormal growths in the nose or throat, such as: Growths that line the nose (nasal polyps). Abnormal growth of cells (tumors). Enlarged tissue at the back of the throat (adenoids). What increases the risk? You are more likely to develop this condition if: You smoke. You are overweight. You are a child who has: Certain birth defects of the mouth, such as cleft palate. Large tonsils or adenoids. What are the signs or symptoms? Common symptoms of this condition include: A feeling of fullness in the ear. Ear pain. Clicking or popping noises in the ear. Ringing in the ear (tinnitus). Hearing loss. Loss of balance. Dizziness. Symptoms may get worse when the air pressure around you changes, such as when you travel to an area of high elevation, fly on an airplane, or go scuba diving. How is this diagnosed? This  condition may be diagnosed based on: Your symptoms. A physical exam of your ears, nose, and throat. Tests, such as those that measure: The movement of your eardrum. Your hearing (audiometry). How is this treated? Treatment depends on the cause and severity of your condition. In mild cases, you may relieve your symptoms by moving air into your ears. This is called "popping the ears." In more severe cases, or if you have symptoms of fluid in your ears, treatment may include: Medicines to relieve congestion (decongestants). Medicines that treat allergies (antihistamines). Nasal sprays or ear drops that contain medicines that reduce swelling (steroids). A procedure to drain the fluid in your eardrum. In this procedure, a small tube may be placed in the eardrum to: Drain the fluid. Restore the air in the middle ear space. A procedure to insert a balloon device through the nose to inflate the opening of the eustachian tube (balloon dilation). Follow these instructions at home: Lifestyle Do not do any of the following until your health care provider approves: Travel to high altitudes. Fly in airplanes. Work in a pressurized cabin or room. Scuba dive. Do not use any products that contain nicotine or tobacco. These products include cigarettes, chewing tobacco, and vaping devices, such as e-cigarettes. If you need help quitting, ask your health care provider. Keep your ears dry. Wear fitted earplugs during showering and bathing. Dry your ears completely after. General instructions Take over-the-counter and prescription medicines only as told by your health care provider. Use techniques to help pop your ears as recommended by your health care provider. These may include: Chewing gum. Yawning. Frequent, forceful swallowing.   Closing your mouth, holding your nose closed, and gently blowing as if you are trying to blow air out of your nose. Keep all follow-up visits. This is important. Contact a  health care provider if: Your symptoms do not go away after treatment. Your symptoms come back after treatment. You are unable to pop your ears. You have: A fever. Pain in your ear. Pain in your head or neck. Fluid draining from your ear. Your hearing suddenly changes. You become very dizzy. You lose your balance. Get help right away if: You have a sudden, severe increase in any of your symptoms. Summary Eustachian tube dysfunction refers to a condition in which a blockage develops in the eustachian tube. It can be caused by ear infections, allergies, inhaled irritants, or abnormal growths in the nose or throat. Symptoms may include ear pain or fullness, hearing loss, or ringing in the ears. Mild cases are treated with techniques to unblock the ears, such as yawning or chewing gum. More severe cases are treated with medicines or procedures. This information is not intended to replace advice given to you by your health care provider. Make sure you discuss any questions you have with your health care provider. Document Revised: 02/19/2021 Document Reviewed: 02/19/2021 Elsevier Patient Education  2023 [employer]  

## 2023-05-05 ENCOUNTER — Other Ambulatory Visit (HOSPITAL_COMMUNITY): Payer: Self-pay

## 2023-05-06 ENCOUNTER — Other Ambulatory Visit (HOSPITAL_COMMUNITY): Payer: Self-pay

## 2023-05-06 ENCOUNTER — Telehealth: Payer: Self-pay | Admitting: Physician Assistant

## 2023-05-06 DIAGNOSIS — R7303 Prediabetes: Secondary | ICD-10-CM

## 2023-05-06 DIAGNOSIS — E6609 Other obesity due to excess calories: Secondary | ICD-10-CM

## 2023-05-06 DIAGNOSIS — Z8632 Personal history of gestational diabetes: Secondary | ICD-10-CM

## 2023-05-06 MED ORDER — SEMAGLUTIDE (2 MG/DOSE) 8 MG/3ML ~~LOC~~ SOPN
2.0000 mg | PEN_INJECTOR | SUBCUTANEOUS | 0 refills | Status: DC
  Filled 2023-05-06 – 2023-05-27 (×2): qty 3, 28d supply, fill #0
  Filled 2023-06-17 – 2023-06-19 (×2): qty 3, 28d supply, fill #1
  Filled 2023-07-17: qty 3, 28d supply, fill #2

## 2023-05-06 NOTE — Telephone Encounter (Signed)
Wegovy not covered. Pt has done well. Hx of prediabetes/obesity/gestational diabetes.  Switch to ozempic. Sent to pharmacy.

## 2023-05-08 ENCOUNTER — Encounter (HOSPITAL_BASED_OUTPATIENT_CLINIC_OR_DEPARTMENT_OTHER): Payer: Self-pay | Admitting: Obstetrics & Gynecology

## 2023-05-13 ENCOUNTER — Other Ambulatory Visit: Payer: Self-pay

## 2023-05-13 ENCOUNTER — Other Ambulatory Visit (HOSPITAL_COMMUNITY): Payer: Self-pay

## 2023-05-27 ENCOUNTER — Other Ambulatory Visit (HOSPITAL_COMMUNITY): Payer: Self-pay

## 2023-06-03 ENCOUNTER — Telehealth: Payer: Self-pay | Admitting: Physician Assistant

## 2023-06-03 DIAGNOSIS — Z87898 Personal history of other specified conditions: Secondary | ICD-10-CM

## 2023-06-03 DIAGNOSIS — D508 Other iron deficiency anemias: Secondary | ICD-10-CM

## 2023-06-03 DIAGNOSIS — E039 Hypothyroidism, unspecified: Secondary | ICD-10-CM

## 2023-06-03 DIAGNOSIS — E559 Vitamin D deficiency, unspecified: Secondary | ICD-10-CM

## 2023-06-03 DIAGNOSIS — E538 Deficiency of other specified B group vitamins: Secondary | ICD-10-CM

## 2023-06-03 DIAGNOSIS — Z13220 Encounter for screening for lipoid disorders: Secondary | ICD-10-CM

## 2023-06-03 DIAGNOSIS — Z0000 Encounter for general adult medical examination without abnormal findings: Secondary | ICD-10-CM

## 2023-06-03 NOTE — Telephone Encounter (Signed)
Labs ordered for CPE

## 2023-06-04 DIAGNOSIS — E559 Vitamin D deficiency, unspecified: Secondary | ICD-10-CM | POA: Diagnosis not present

## 2023-06-04 DIAGNOSIS — Z87898 Personal history of other specified conditions: Secondary | ICD-10-CM | POA: Diagnosis not present

## 2023-06-04 DIAGNOSIS — E039 Hypothyroidism, unspecified: Secondary | ICD-10-CM | POA: Diagnosis not present

## 2023-06-04 DIAGNOSIS — Z0000 Encounter for general adult medical examination without abnormal findings: Secondary | ICD-10-CM | POA: Diagnosis not present

## 2023-06-04 DIAGNOSIS — Z13220 Encounter for screening for lipoid disorders: Secondary | ICD-10-CM | POA: Diagnosis not present

## 2023-06-05 ENCOUNTER — Other Ambulatory Visit: Payer: Self-pay | Admitting: Physician Assistant

## 2023-06-05 LAB — COMPREHENSIVE METABOLIC PANEL
ALT: 7 IU/L (ref 0–32)
AST: 9 IU/L (ref 0–40)
Albumin/Globulin Ratio: 1.9
Albumin: 4.2 g/dL (ref 3.9–4.9)
Alkaline Phosphatase: 70 IU/L (ref 44–121)
BUN/Creatinine Ratio: 17 (ref 9–23)
BUN: 12 mg/dL (ref 6–20)
Bilirubin Total: 0.2 mg/dL (ref 0.0–1.2)
CO2: 22 mmol/L (ref 20–29)
Calcium: 9 mg/dL (ref 8.7–10.2)
Chloride: 106 mmol/L (ref 96–106)
Creatinine, Ser: 0.71 mg/dL (ref 0.57–1.00)
Globulin, Total: 2.2 g/dL (ref 1.5–4.5)
Glucose: 86 mg/dL (ref 70–99)
Potassium: 4.4 mmol/L (ref 3.5–5.2)
Sodium: 139 mmol/L (ref 134–144)
Total Protein: 6.4 g/dL (ref 6.0–8.5)
eGFR: 113 mL/min/{1.73_m2} (ref >59)

## 2023-06-05 LAB — LIPID PANEL
Chol/HDL Ratio: 2.8 ratio (ref 0.0–4.4)
Cholesterol, Total: 113 mg/dL (ref 100–199)
HDL: 40 mg/dL (ref >39)
LDL Chol Calc (NIH): 57 mg/dL (ref 0–99)
Triglycerides: 82 mg/dL (ref 0–149)
VLDL Cholesterol Cal: 16 mg/dL (ref 5–40)

## 2023-06-05 LAB — CBC WITH DIFFERENTIAL/PLATELET
Basophils Absolute: 0 10*3/uL (ref 0.0–0.2)
Basos: 1 %
EOS (ABSOLUTE): 0.2 10*3/uL (ref 0.0–0.4)
Eos: 3 %
Hematocrit: 30.3 % — ABNORMAL LOW (ref 34.0–46.6)
Hemoglobin: 8.7 g/dL — ABNORMAL LOW (ref 11.1–15.9)
Immature Grans (Abs): 0 10*3/uL (ref 0.0–0.1)
Immature Granulocytes: 0 %
Lymphocytes Absolute: 2.6 10*3/uL (ref 0.7–3.1)
Lymphs: 40 %
MCH: 21.3 pg — ABNORMAL LOW (ref 26.6–33.0)
MCHC: 28.7 g/dL — ABNORMAL LOW (ref 31.5–35.7)
MCV: 74 fL — ABNORMAL LOW (ref 79–97)
Monocytes Absolute: 0.4 10*3/uL (ref 0.1–0.9)
Monocytes: 6 %
Neutrophils Absolute: 3.3 10*3/uL (ref 1.4–7.0)
Neutrophils: 50 %
Platelets: 461 10*3/uL — ABNORMAL HIGH (ref 150–450)
RBC: 4.08 x10E6/uL (ref 3.77–5.28)
RDW: 15.2 % (ref 11.7–15.4)
WBC: 6.5 10*3/uL (ref 3.4–10.8)

## 2023-06-05 LAB — IRON,TIBC AND FERRITIN PANEL
Ferritin: 5 ng/mL — ABNORMAL LOW (ref 15–150)
Iron Saturation: 4 % — CL (ref 15–55)
Iron: 15 ug/dL — ABNORMAL LOW (ref 27–159)
Total Iron Binding Capacity: 380 ug/dL (ref 250–450)
UIBC: 365 ug/dL (ref 131–425)

## 2023-06-05 LAB — VITAMIN D 25 HYDROXY (VIT D DEFICIENCY, FRACTURES): Vit D, 25-Hydroxy: 16.4 ng/mL — ABNORMAL LOW (ref 30.0–100.0)

## 2023-06-05 LAB — HEMOGLOBIN A1C
Est. average glucose Bld gHb Est-mCnc: 117 mg/dL
Hgb A1c MFr Bld: 5.7 % — ABNORMAL HIGH (ref 4.8–5.6)

## 2023-06-05 LAB — TSH: TSH: 2.84 u[IU]/mL (ref 0.450–4.500)

## 2023-06-06 ENCOUNTER — Other Ambulatory Visit: Payer: Self-pay

## 2023-06-06 MED ORDER — LEVOTHYROXINE SODIUM 112 MCG PO TABS
112.0000 ug | ORAL_TABLET | Freq: Every morning | ORAL | 3 refills | Status: DC
  Filled 2023-06-06: qty 90, 90d supply, fill #0
  Filled 2023-09-03: qty 90, 90d supply, fill #1
  Filled 2023-11-21 – 2023-11-25 (×2): qty 90, 90d supply, fill #2
  Filled 2024-02-17: qty 90, 90d supply, fill #3

## 2023-06-06 NOTE — Progress Notes (Signed)
[place],  Kidney, liver, glucose look good.  A1C in pre-diabetes range but better than 1 year ago.  Thyroid looks good.  Cholesterol looks GREAT.  Vitamin D very low! I would start 5000 units daily with dairy.  Hemoglobin continues to decrease, iron storage and serum iron low.  Thoughts on getting iron infusion?  You are not taking any oral iron? How is bleeding?

## 2023-06-09 ENCOUNTER — Other Ambulatory Visit: Payer: Self-pay | Admitting: Physician Assistant

## 2023-06-09 DIAGNOSIS — R790 Abnormal level of blood mineral: Secondary | ICD-10-CM

## 2023-06-09 DIAGNOSIS — D508 Other iron deficiency anemias: Secondary | ICD-10-CM

## 2023-06-09 DIAGNOSIS — Z888 Allergy status to other drugs, medicaments and biological substances status: Secondary | ICD-10-CM

## 2023-06-18 ENCOUNTER — Other Ambulatory Visit: Payer: Self-pay

## 2023-06-18 ENCOUNTER — Ambulatory Visit (INDEPENDENT_AMBULATORY_CARE_PROVIDER_SITE_OTHER): Payer: 59 | Admitting: Physician Assistant

## 2023-06-18 ENCOUNTER — Other Ambulatory Visit (HOSPITAL_COMMUNITY): Payer: Self-pay

## 2023-06-18 ENCOUNTER — Encounter: Payer: Self-pay | Admitting: Physician Assistant

## 2023-06-18 VITALS — BP 132/88 | HR 80 | Ht 63.5 in | Wt 199.0 lb

## 2023-06-18 DIAGNOSIS — E559 Vitamin D deficiency, unspecified: Secondary | ICD-10-CM

## 2023-06-18 DIAGNOSIS — R7303 Prediabetes: Secondary | ICD-10-CM | POA: Diagnosis not present

## 2023-06-18 DIAGNOSIS — R011 Cardiac murmur, unspecified: Secondary | ICD-10-CM | POA: Diagnosis not present

## 2023-06-18 DIAGNOSIS — Z0000 Encounter for general adult medical examination without abnormal findings: Secondary | ICD-10-CM | POA: Diagnosis not present

## 2023-06-18 DIAGNOSIS — E039 Hypothyroidism, unspecified: Secondary | ICD-10-CM | POA: Diagnosis not present

## 2023-06-18 DIAGNOSIS — E6609 Other obesity due to excess calories: Secondary | ICD-10-CM

## 2023-06-18 DIAGNOSIS — D508 Other iron deficiency anemias: Secondary | ICD-10-CM | POA: Diagnosis not present

## 2023-06-18 MED ORDER — VITAMIN D (ERGOCALCIFEROL) 1.25 MG (50000 UNIT) PO CAPS
50000.0000 [IU] | ORAL_CAPSULE | ORAL | 0 refills | Status: DC
Start: 2023-06-18 — End: 2024-01-16
  Filled 2023-06-18: qty 12, 84d supply, fill #0

## 2023-06-18 NOTE — Patient Instructions (Signed)

## 2023-06-18 NOTE — Progress Notes (Signed)
Complete physical exam  Patient: Cathy Daniels   DOB: 05-02-1986   37 y.o. Female  MRN: [id number]  Subjective:    Chief Complaint  Patient presents with   Annual Exam    Cathy Daniels is a 37 y.o. female who presents today for a complete physical exam. She reports consuming a general diet.  She is active with her kids and tries to walk regularly.   She generally feels well. She reports sleeping well. She does not have additional problems to discuss today.   Consult for hematology and iron infusion July 2nd. Most recent depression screenings:    06/18/2023   11:31 AM 01/21/2023    4:22 PM  PHQ 2/9 Scores  PHQ - 2 Score 0 0  PHQ- 9 Score 0     Vision:Within last year and Dental: No current dental problems and Receives regular dental care  Patient Active Problem List   Diagnosis Date Noted   Systolic murmur 06/18/2023   Ear fullness, bilateral 04/11/2023   Tinnitus of both ears 04/11/2023   Seasonal allergies 04/11/2023   ETD (Eustachian tube dysfunction), bilateral 04/11/2023   Adjustment disorder with anxiety 09/19/2022   Stress 09/11/2022   Low serum vitamin B12 06/04/2022   Pre-diabetes 06/03/2022   Iron deficiency anemia secondary to inadequate dietary iron intake 06/03/2022   Elevated blood pressure reading 06/03/2022   Newly recognized heart murmur 06/03/2022   Class 1 obesity due to excess calories without serious comorbidity with body mass index (BMI) of 34.0 to 34.9 in adult 06/03/2022   NSVD (normal spontaneous vaginal delivery) 12/23/2018   Diabetes mellitus affecting pregnancy in third trimester 12/22/2018   Vitamin D deficiency 02/03/2017   Hypothyroidism 01/28/2017   History of prediabetes 01/28/2017   History of gestational diabetes 01/28/2017   Irregular periods 01/28/2017   Eczema of both hands 01/28/2017   Situational anxiety 01/28/2017   History of asthma 01/28/2017   Menstrual cramp 01/28/2017   Vaginal delivery 12/17/2013   ALLERGIC RHINITIS  CAUSE UNSPECIFIED 03/30/2010   Asthma, moderate persistent 04/19/2008   Past Medical History:  Diagnosis Date   Abnormal pap 2007, 2008   colpo x 3   Anemia    Asthma    smoke induced, no inhaler used in  over a year   Genital warts    GERD (gastroesophageal reflux disease)    Gestational diabetes    Hypothyroid    Infection 2017   UTI   Pregnancy induced hypertension    Seasonal allergies    Vaginal Pap smear, abnormal    had colpo- normal since   Past Surgical History:  Procedure Laterality Date   COLPOSCOPY  3/07, 8/07, 1/08   CIN II, benign, CIN I   WISDOM TOOTH EXTRACTION     Family History  Problem Relation Age of Onset   Diabetes Father    Hypertension Mother    Diabetes Other        PU x 5, PA x 1   Glaucoma Maternal Grandmother    Hypertension Maternal Grandfather    Hearing loss Neg Hx    Allergies  Allergen Reactions   Ferrous Sulfate Hives    Repliva OK   Sulfonamide Derivatives Hives   Latex Hives, Itching and Rash    Itching and rash when wearing latex gloves      Patient Care Team: Nolene Ebbs as PCP - General (Family Medicine) Dorisann Frames, MD as Attending Physician (Endocrinology)   Outpatient Medications Prior to Visit  Medication Sig   fluticasone (FLONASE) 50 MCG/ACT nasal spray Place 2 sprays into both nostrils daily.   fluticasone-salmeterol (ADVAIR DISKUS) 250-50 MCG/ACT AEPB Inhale 1 puff into the lungs in the morning and at bedtime.   ibuprofen (ADVIL,MOTRIN) 600 MG tablet Take 1 tablet (600 mg total) by mouth every 6 (six) hours as needed for cramping.   levothyroxine (SYNTHROID) 112 MCG tablet Take 1 tablet (112 mcg total) by mouth every morning on an empty stomach.   Semaglutide, 2 MG/DOSE, 8 MG/3ML SOPN Inject 2 mg as directed once a week.   tranexamic acid (LYSTEDA) 650 MG TABS tablet Take 2 tablets (1,300 mg total) by mouth 3 (three) times daily.   [DISCONTINUED] WEGOVY 2.4 MG/0.75ML SOAJ Inject 2.4 mg into the  skin once a week.   No facility-administered medications prior to visit.    Review of Systems  All other systems reviewed and are negative.         Objective:     BP 132/88 (BP Location: Left Arm, Patient Position: Sitting, Cuff Size: Normal)   Pulse 80   Ht 5' 3.5" (1.613 m)   Wt 199 lb (90.3 kg)   SpO2 100%   BMI 34.70 kg/m  BP Readings from Last 3 Encounters:  06/18/23 132/88  04/11/23 132/86  01/21/23 128/82   Wt Readings from Last 3 Encounters:  06/18/23 199 lb (90.3 kg)  04/11/23 202 lb (91.6 kg)  01/21/23 214 lb 9.6 oz (97.3 kg)      Physical Exam  BP 132/88 (BP Location: Left Arm, Patient Position: Sitting, Cuff Size: Normal)   Pulse 80   Ht 5' 3.5" (1.613 m)   Wt 199 lb (90.3 kg)   SpO2 100%   BMI 34.70 kg/m   General Appearance:    Alert, cooperative, no distress, appears stated age  Head:    Normocephalic, without obvious abnormality, atraumatic  Eyes:    PERRL, conjunctiva/corneas clear, EOM's intact, fundi    benign, both eyes  Ears:    Normal TM's and external ear canals, both ears  Nose:   Nares normal, septum midline, mucosa normal, no drainage    or sinus tenderness  Throat:   Lips, mucosa, and tongue normal; teeth and gums normal  Neck:   Supple, symmetrical, trachea midline, no adenopathy;    thyroid:  no enlargement/tenderness/nodules; no carotid   bruit or JVD  Back:     Symmetric, no curvature, ROM normal, no CVA tenderness  Lungs:     Clear to auscultation bilaterally, respirations unlabored  Chest Wall:    No tenderness or deformity   Heart:    Regular rate and rhythm, S1 and S2 normal, 2/6 systolic murmur, rub   or gallop     Abdomen:     Soft, non-tender, bowel sounds active all four quadrants,    no masses, no organomegaly        Extremities:   Extremities normal, atraumatic, no cyanosis or edema  Pulses:   2+ and symmetric all extremities  Skin:   Skin color, texture, turgor normal, no rashes or lesions  Lymph nodes:    Cervical, supraclavicular, and axillary nodes normal  Neurologic:   CNII-XII intact, normal strength, sensation and reflexes    throughout      Assessment & Plan:    Routine Health Maintenance and Physical Exam  Immunization History  Administered Date(s) Administered   DTaP 08/23/1986, 09/23/1986, 10/23/1986, 01/07/1988, 08/06/1988   HPV Quadrivalent 07/01/2006, 08/29/2006, 01/16/2007   Hepatitis B,  PED/ADOLESCENT 05/24/1997, 06/24/1997, 11/24/1997   IPV 14-Jun-1986, 08/23/1986, 09/23/1986, 10/23/1986, 01/07/1988   Influenza Split 11/15/2009, 11/23/2010, 08/28/2018   Influenza Whole 11/22/2009, 09/23/2011   Influenza,inj,Quad PF,6+ Mos 09/18/2022   Influenza,inj,quad, With Preservative 08/28/2018   Influenza-Unspecified 08/23/2016, 09/22/2016   MMR 04/26/1987, 10/24/1987, 11/21/1997   PFIZER(Purple Top)SARS-COV-2 Vaccination 01/11/2020, 02/01/2020, 12/14/2020   Td 11/21/1997, 11/15/2009   Tdap 11/15/2009, 10/01/2018   Varicella 11/22/2009, 01/12/2010    Health Maintenance  Topic Date Due   FOOT EXAM  Never done   Diabetic kidney evaluation - Urine ACR  12/23/2019   COVID-19 Vaccine (4 - 2023-24 season) 07/04/2023 (Originally 08/23/2022)   INFLUENZA VACCINE  07/24/2023   HEMOGLOBIN A1C  12/04/2023   OPHTHALMOLOGY EXAM  12/20/2023   Diabetic kidney evaluation - eGFR measurement  06/03/2024   PAP SMEAR-Modifier  01/21/2026   DTaP/Tdap/Td (9 - Td or Tdap) 10/01/2028   HPV VACCINES  Completed   Hepatitis C Screening  Completed   HIV Screening  Completed    Discussed health benefits of physical activity, and encouraged her to engage in regular exercise appropriate for her age and condition.  Marland KitchenNorma Fredrickson was seen today for annual exam.  Diagnoses and all orders for this visit:  Routine physical examination  Iron deficiency anemia secondary to inadequate dietary iron intake  Acquired hypothyroidism  Pre-diabetes  Systolic murmur  Vitamin D deficiency -     Vitamin D,  Ergocalciferol, (DRISDOL) 1.25 MG (50000 UNIT) CAPS capsule; Take 1 capsule (50,000 Units total) by mouth every 7 (seven) days. Take for 8 total doses(weeks)  Class 1 obesity due to excess calories without serious comorbidity with body mass index (BMI) of 34.0 to 34.9 in adult    Discussed 150 minutes of exercise a week.  Encouraged vitamin D 1000 units and Calcium 1300mg  or 4 servings of dairy a day.  PHQ no concerns Discussed labs IDA-appt with hematology July 2nd to get infusion Vitamin D def start weekly vitamin d with daily 2000 units Mammogram and Pap UTD Continue on ozempic 2mg  weekly for pre-diabetes and weight loss Declined covid booster Vaccines UTD Murmur heard on exam but no SOB,swelling will continue to monitor   Return in about 6 months (around 12/18/2023), or if symptoms worsen or fail to improve.     Tandy Gaw, [name]

## 2023-06-27 ENCOUNTER — Inpatient Hospital Stay: Payer: 59 | Attending: Family | Admitting: Family

## 2023-06-27 ENCOUNTER — Inpatient Hospital Stay: Payer: 59

## 2023-06-27 ENCOUNTER — Encounter: Payer: Self-pay | Admitting: Family

## 2023-06-27 ENCOUNTER — Other Ambulatory Visit: Payer: Self-pay

## 2023-06-27 VITALS — BP 121/86 | HR 79 | Temp 98.2°F | Resp 18 | Ht 63.5 in | Wt 197.0 lb

## 2023-06-27 VITALS — BP 128/85 | HR 72 | Resp 18

## 2023-06-27 DIAGNOSIS — D508 Other iron deficiency anemias: Secondary | ICD-10-CM

## 2023-06-27 DIAGNOSIS — N920 Excessive and frequent menstruation with regular cycle: Secondary | ICD-10-CM | POA: Diagnosis not present

## 2023-06-27 DIAGNOSIS — D500 Iron deficiency anemia secondary to blood loss (chronic): Secondary | ICD-10-CM | POA: Insufficient documentation

## 2023-06-27 MED ORDER — SODIUM CHLORIDE 0.9 % IV SOLN: Freq: Once | INTRAVENOUS | Status: AC

## 2023-06-27 MED ORDER — SODIUM CHLORIDE 0.9 % IV SOLN
300.0000 mg | Freq: Once | INTRAVENOUS | Status: AC
  Administered 2023-06-27: 300 mg via INTRAVENOUS
  Filled 2023-06-27: qty 300

## 2023-06-27 NOTE — Progress Notes (Signed)
Hematology/Oncology Consultation   Name: Cathy Daniels      MRN: [id number]    Location: Room/bed info not found  Date: 06/27/2023 Time:8:55 AM   REFERRING PHYSICIAN: Tandy Gaw, [name]  REASON FOR CONSULT: Iron deficiency anemia    DIAGNOSIS: Iron deficiency anemia secondary to heavy cycle  HISTORY OF PRESENT ILLNESS: Cathy Daniels is a very pleasant 37 yo female with long history of IDA.  She has not responded to oral iron in the past and experienced constipation while taking. She had hives with ferrous sulfate which was determined to be due to the sulfate. We discussed premedication with IV iron and she would prefer to try without first with the ferrous sucrose. We can add pre meds later if needed. Iron saturation was down to 4%  and ferritin only 5 last month.  She is symptomatic with fatigue and chills.  She states that her cycle is heavy with clots at times. She had started Lysteda in February and had lighter cycles until last month's which was heavy again. She is not taking at the Lysteda at this time.  No other blood loss noted. No bruising or petechiae.  Her mother also has history of anemia with uterine fibroids and heavy cycles eventually requiring hysterectomy which corrected her anemia.  She takes vitamin D once a week for deficiency.  Only past surgical history is wisdom teeth extraction and she denies any complications.  She has 2 children and no history of miscarriage.  No personal or known familial history of cancer.  She notes SOB with wheezing with asthma exacerbation.  Occasional palpitations and anxiety.   No fever, n/v, cough, rash, dizziness, chest pain, abdominal pain or changes in bowel or bladder habits.  No swelling, tenderness, numbness or tingling in her extremities.  No falls or syncope.  No smoking or recreational drug use. Occasional glass of wine.  Appetite and hydration are good. She states that Ozempic has taken away her craving for Cokes.  She is prediabetic  with history of gestational diabetes.  She also has hypothyroidism and is on Synthroid.   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  ROS: All other 10 point review of systems is negative.   PAST MEDICAL HISTORY:   Past Medical History:  Diagnosis Date   Abnormal pap 2007, 2008   colpo x 3   Anemia    Asthma    smoke induced, no inhaler used in  over a year   Genital warts    GERD (gastroesophageal reflux disease)    Gestational diabetes    Hypothyroid    Infection 2017   UTI   Pregnancy induced hypertension    Seasonal allergies    Vaginal Pap smear, abnormal    had colpo- normal since    ALLERGIES: Allergies  Allergen Reactions   Ferrous Sulfate Hives    Repliva OK   Sulfonamide Derivatives Hives   Latex Hives, Itching and Rash    Itching and rash when wearing latex gloves      MEDICATIONS:  Current Outpatient Medications on File Prior to Visit  Medication Sig Dispense Refill   fluticasone (FLONASE) 50 MCG/ACT nasal spray Place 2 sprays into both nostrils daily. 16 g 2   fluticasone-salmeterol (ADVAIR DISKUS) 250-50 MCG/ACT AEPB Inhale 1 puff into the lungs in the morning and at bedtime. 60 each 3   ibuprofen (ADVIL,MOTRIN) 600 MG tablet Take 1 tablet (600 mg total) by mouth every 6 (six) hours  as needed for cramping. 40 tablet 1   levothyroxine (SYNTHROID) 112 MCG tablet Take 1 tablet (112 mcg total) by mouth every morning on an empty stomach. 90 tablet 3   Semaglutide, 2 MG/DOSE, 8 MG/3ML SOPN Inject 2 mg as directed once a week. 9 mL 0   tranexamic acid (LYSTEDA) 650 MG TABS tablet Take 2 tablets (1,300 mg total) by mouth 3 (three) times daily. 30 tablet 3   Vitamin D, Ergocalciferol, (DRISDOL) 1.25 MG (50000 UNIT) CAPS capsule Take 1 capsule (50,000 Units total) by mouth every 7 (seven) days. Take for 8 total doses(weeks) 12 capsule 0   No current facility-administered medications on file prior to visit.     PAST SURGICAL  HISTORY Past Surgical History:  Procedure Laterality Date   COLPOSCOPY  3/07, 8/07, 1/08   CIN II, benign, CIN I   WISDOM TOOTH EXTRACTION      FAMILY HISTORY: Family History  Problem Relation Age of Onset   Diabetes Father    Hypertension Mother    Diabetes Other        PU x 5, PA x 1   Glaucoma Maternal Grandmother    Hypertension Maternal Grandfather    Hearing loss Neg Hx     SOCIAL HISTORY:  reports that she has never smoked. She has never used smokeless tobacco. She reports current alcohol use. She reports that she does not use drugs.  PERFORMANCE STATUS: The patient's performance status is 1 - Symptomatic but completely ambulatory  PHYSICAL EXAM: Most Recent Vital Signs: There were no vitals taken for this visit. BP 121/86 (Patient Position: Sitting)   Pulse 79   Temp 98.2 F (36.8 C) (Oral)   Resp 18   Ht 5' 3.5" (1.613 m)   Wt 197 lb (89.4 kg)   SpO2 100%   BMI 34.35 kg/m   General Appearance:    Alert, cooperative, no distress, appears stated age  Head:    Normocephalic, without obvious abnormality, atraumatic  Eyes:    PERRL, conjunctiva/corneas clear, EOM's intact, fundi    benign, both eyes        Throat:   Lips, mucosa, and tongue normal; teeth and gums normal  Neck:   Supple, symmetrical, trachea midline, no adenopathy;    thyroid:  no enlargement/tenderness/nodules; no carotid   bruit or JVD  Back:     Symmetric, no curvature, ROM normal, no CVA tenderness  Lungs:     Clear to auscultation bilaterally, respirations unlabored  Chest Wall:    No tenderness or deformity   Heart:    Regular rate and rhythm, S1 and S2 normal, no murmur, rub   or gallop     Abdomen:     Soft, non-tender, bowel sounds active all four quadrants,    no masses, no organomegaly        Extremities:   Extremities normal, atraumatic, no cyanosis or edema  Pulses:   2+ and symmetric all extremities  Skin:   Skin color, texture, turgor normal, no rashes or lesions  Lymph  nodes:   Cervical, supraclavicular, and axillary nodes normal  Neurologic:   CNII-XII intact, normal strength, sensation and reflexes    throughout    LABORATORY DATA:  No results found for this or any previous visit (from the past 48 hour(s)).    RADIOGRAPHY: No results found.     PATHOLOGY: None   ASSESSMENT/PLAN: Ms. Joerg is a very pleasant 37 yo female with long history of IDA secondary to heavy  cycles. We will get her set up for 3 doses of IV iron starting today.  Follow-up in 8 weeks.    All questions were answered. The patient knows to call the clinic with any problems, questions or concerns. We can certainly see the patient much sooner if necessary.   Eileen Stanford, NP

## 2023-06-27 NOTE — Patient Instructions (Signed)

## 2023-07-04 ENCOUNTER — Inpatient Hospital Stay: Payer: 59

## 2023-07-04 VITALS — BP 124/90 | HR 87 | Temp 97.8°F | Resp 17

## 2023-07-04 DIAGNOSIS — N920 Excessive and frequent menstruation with regular cycle: Secondary | ICD-10-CM | POA: Diagnosis not present

## 2023-07-04 DIAGNOSIS — D508 Other iron deficiency anemias: Secondary | ICD-10-CM

## 2023-07-04 DIAGNOSIS — D500 Iron deficiency anemia secondary to blood loss (chronic): Secondary | ICD-10-CM | POA: Diagnosis not present

## 2023-07-04 MED ORDER — SODIUM CHLORIDE 0.9 % IV SOLN
300.0000 mg | Freq: Once | INTRAVENOUS | Status: AC
  Administered 2023-07-04: 300 mg via INTRAVENOUS
  Filled 2023-07-04: qty 300

## 2023-07-04 MED ORDER — SODIUM CHLORIDE 0.9 % IV SOLN: Freq: Once | INTRAVENOUS | Status: AC

## 2023-07-04 NOTE — Patient Instructions (Signed)

## 2023-07-11 ENCOUNTER — Inpatient Hospital Stay: Payer: 59

## 2023-07-17 ENCOUNTER — Other Ambulatory Visit (HOSPITAL_COMMUNITY): Payer: Self-pay

## 2023-07-17 ENCOUNTER — Other Ambulatory Visit: Payer: Self-pay

## 2023-07-18 ENCOUNTER — Inpatient Hospital Stay: Payer: 59

## 2023-07-18 VITALS — BP 128/84 | HR 82 | Temp 98.0°F | Resp 17

## 2023-07-18 DIAGNOSIS — N920 Excessive and frequent menstruation with regular cycle: Secondary | ICD-10-CM | POA: Diagnosis not present

## 2023-07-18 DIAGNOSIS — D508 Other iron deficiency anemias: Secondary | ICD-10-CM

## 2023-07-18 DIAGNOSIS — D500 Iron deficiency anemia secondary to blood loss (chronic): Secondary | ICD-10-CM | POA: Diagnosis not present

## 2023-07-18 MED ORDER — SODIUM CHLORIDE 0.9 % IV SOLN: Freq: Once | INTRAVENOUS | Status: AC

## 2023-07-18 MED ORDER — SODIUM CHLORIDE 0.9 % IV SOLN
300.0000 mg | Freq: Once | INTRAVENOUS | Status: AC
  Administered 2023-07-18: 300 mg via INTRAVENOUS
  Filled 2023-07-18: qty 300

## 2023-07-18 NOTE — Patient Instructions (Signed)
Iron Sucrose Injection What is this medication? IRON SUCROSE (EYE ern SOO krose) treats low levels of iron (iron deficiency anemia) in people with kidney disease. Iron is a mineral that plays an important role in making red blood cells, which carry oxygen from your lungs to the rest of your body. This medicine may be used for other purposes; ask your health care provider or pharmacist if you have questions. COMMON BRAND NAME(S): Venofer What should I tell my care team before I take this medication? They need to know if you have any of these conditions: Anemia not caused by low iron levels Heart disease High levels of iron in the blood Kidney disease Liver disease An unusual or allergic reaction to iron, other medications, foods, dyes, or preservatives Pregnant or trying to get pregnant Breastfeeding How should I use this medication? This medication is for infusion into a vein. It is given in a hospital or clinic setting. Talk to your care team about the use of this medication in children. While this medication may be prescribed for children as young as 2 years for selected conditions, precautions do apply. Overdosage: If you think you have taken too much of this medicine contact a poison control center or emergency room at once. NOTE: This medicine is only for you. Do not share this medicine with others. What if I miss a dose? Keep appointments for follow-up doses. It is important not to miss your dose. Call your care team if you are unable to keep an appointment. What may interact with this medication? Do not take this medication with any of the following: Deferoxamine Dimercaprol Other iron products This medication may also interact with the following: Chloramphenicol Deferasirox This list may not describe all possible interactions. Give your health care provider a list of all the medicines, herbs, non-prescription drugs, or dietary supplements you use. Also tell them if you smoke,  drink alcohol, or use illegal drugs. Some items may interact with your medicine. What should I watch for while using this medication? Visit your care team regularly. Tell your care team if your symptoms do not start to get better or if they get worse. You may need blood work done while you are taking this medication. You may need to follow a special diet. Talk to your care team. Foods that contain iron include: whole grains/cereals, dried fruits, beans, or peas, leafy green vegetables, and organ meats (liver, kidney). What side effects may I notice from receiving this medication? Side effects that you should report to your care team as soon as possible: Allergic reactions--skin rash, itching, hives, swelling of the face, lips, tongue, or throat Low blood pressure--dizziness, feeling faint or lightheaded, blurry vision Shortness of breath Side effects that usually do not require medical attention (report to your care team if they continue or are bothersome): Flushing Headache Joint pain Muscle pain Nausea Pain, redness, or irritation at injection site This list may not describe all possible side effects. Call your doctor for medical advice about side effects. You may report side effects to FDA at 1-800-FDA-1088. Where should I keep my medication? This medication is given in a hospital or clinic. It will not be stored at home. NOTE: This sheet is a summary. It may not cover all possible information. If you have questions about this medicine, talk to your doctor, pharmacist, or health care provider.  2024 Elsevier/Gold Standard (2023-05-16 00:00:00)

## 2023-08-06 ENCOUNTER — Other Ambulatory Visit: Payer: Self-pay | Admitting: Physician Assistant

## 2023-08-07 ENCOUNTER — Other Ambulatory Visit: Payer: Self-pay

## 2023-08-07 ENCOUNTER — Other Ambulatory Visit (HOSPITAL_COMMUNITY): Payer: Self-pay

## 2023-08-07 MED ORDER — OZEMPIC (2 MG/DOSE) 8 MG/3ML ~~LOC~~ SOPN
2.0000 mg | PEN_INJECTOR | SUBCUTANEOUS | 0 refills | Status: DC
  Filled 2023-08-07 – 2023-08-15 (×2): qty 3, 28d supply, fill #0
  Filled 2023-09-03 – 2023-09-08 (×2): qty 3, 28d supply, fill #1
  Filled 2023-10-13: qty 3, 28d supply, fill #2

## 2023-08-15 ENCOUNTER — Other Ambulatory Visit: Payer: Self-pay

## 2023-08-27 ENCOUNTER — Inpatient Hospital Stay: Payer: 59 | Attending: Family

## 2023-08-27 ENCOUNTER — Encounter: Payer: Self-pay | Admitting: Family

## 2023-08-27 ENCOUNTER — Inpatient Hospital Stay: Payer: 59 | Admitting: Family

## 2023-08-27 VITALS — BP 125/85 | HR 87 | Temp 98.2°F | Resp 17 | Wt 199.8 lb

## 2023-08-27 DIAGNOSIS — D508 Other iron deficiency anemias: Secondary | ICD-10-CM | POA: Diagnosis not present

## 2023-08-27 DIAGNOSIS — D500 Iron deficiency anemia secondary to blood loss (chronic): Secondary | ICD-10-CM | POA: Insufficient documentation

## 2023-08-27 DIAGNOSIS — N920 Excessive and frequent menstruation with regular cycle: Secondary | ICD-10-CM | POA: Diagnosis not present

## 2023-08-27 LAB — CBC WITH DIFFERENTIAL (CANCER CENTER ONLY)
Abs Immature Granulocytes: 0.14 10*3/uL — ABNORMAL HIGH (ref 0.00–0.07)
Basophils Absolute: 0.1 10*3/uL (ref 0.0–0.1)
Basophils Relative: 1 %
Eosinophils Absolute: 0.2 10*3/uL (ref 0.0–0.5)
Eosinophils Relative: 2 %
HCT: 37.6 % (ref 36.0–46.0)
Hemoglobin: 11.7 g/dL — ABNORMAL LOW (ref 12.0–15.0)
Immature Granulocytes: 1 %
Lymphocytes Relative: 19 %
Lymphs Abs: 2.2 10*3/uL (ref 0.7–4.0)
MCH: 25.1 pg — ABNORMAL LOW (ref 26.0–34.0)
MCHC: 31.1 g/dL (ref 30.0–36.0)
MCV: 80.5 fL (ref 80.0–100.0)
Monocytes Absolute: 0.4 10*3/uL (ref 0.1–1.0)
Monocytes Relative: 4 %
Neutro Abs: 8.4 10*3/uL — ABNORMAL HIGH (ref 1.7–7.7)
Neutrophils Relative %: 73 %
Platelet Count: 387 10*3/uL (ref 150–400)
RBC: 4.67 MIL/uL (ref 3.87–5.11)
RDW: 18 % — ABNORMAL HIGH (ref 11.5–15.5)
WBC Count: 11.4 10*3/uL — ABNORMAL HIGH (ref 4.0–10.5)
nRBC: 0 % (ref 0.0–0.2)

## 2023-08-27 LAB — IRON AND IRON BINDING CAPACITY (CC-WL,HP ONLY)
Iron: 32 ug/dL (ref 28–170)
Saturation Ratios: 9 % — ABNORMAL LOW (ref 10.4–31.8)
TIBC: 375 ug/dL (ref 250–450)
UIBC: 343 ug/dL (ref 148–442)

## 2023-08-27 LAB — RETICULOCYTES
Immature Retic Fract: 6.6 % (ref 2.3–15.9)
RBC.: 4.68 MIL/uL (ref 3.87–5.11)
Retic Count, Absolute: 53.4 10*3/uL (ref 19.0–186.0)
Retic Ct Pct: 1.1 % (ref 0.4–3.1)

## 2023-08-27 LAB — FERRITIN: Ferritin: 45 ng/mL (ref 11–307)

## 2023-08-27 LAB — LACTATE DEHYDROGENASE: LDH: 140 U/L (ref 98–192)

## 2023-08-27 NOTE — Progress Notes (Signed)
Hematology and Oncology Follow Up Visit  Cathy Daniels [id number] 1986/06/16 37 y.o. 08/27/2023   Principle Diagnosis:  Iron deficiency anemia secondary to heavy cycle  Current Therapy:   IV iron as indicated    Interim History:  Cathy Daniels is here today for follow-up. She tolerated IV iron nicely and did note an improvement in her symptoms. She has now started her cycle and is feeling fatigued again.  Her cycle is regular with heavy flow. She is not ready to move forward with uterine ablation.  No other blood loss noted.  No bruising or petechiae.  No fever, chills, n/v, cough, rash, dizziness, SOB, chest pain, palpitations, abdominal pain or changes in bowel or bladder habits.  No swelling, tenderness, numbness or tingling in her extremities.  No falls or syncope reported.  Appetite and hydration are good. Weight is 199 lbs.  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 WOO HOO!!!!!  ECOG Performance Status: 1 - Symptomatic but completely ambulatory  Medications:  Allergies as of 08/27/2023       Reactions   Ferrous Sulfate Hives   Repliva OK   Sulfonamide Derivatives Hives   Latex Hives, Itching, Rash   Itching and rash when wearing latex gloves        Medication List        Accurate as of August 27, 2023  1:22 PM. If you have any questions, ask your nurse or doctor.          Advair Diskus 250-50 MCG/ACT Aepb Generic drug: fluticasone-salmeterol Inhale 1 puff into the lungs in the morning and at bedtime.   fluticasone 50 MCG/ACT nasal spray Commonly known as: FLONASE Place 2 sprays into both nostrils daily.   ibuprofen 600 MG tablet Commonly known as: ADVIL Take 1 tablet (600 mg total) by mouth every 6 (six) hours as needed for cramping.   levothyroxine 112 MCG tablet Commonly known as: SYNTHROID Take 1 tablet (112 mcg total) by mouth every morning on an empty stomach.   Ozempic (2 MG/DOSE) 8 MG/3ML Sopn Generic drug: Semaglutide (2 MG/DOSE) Inject  2 mg as directed once a week.   tranexamic acid 650 MG Tabs tablet Commonly known as: LYSTEDA Take 2 tablets (1,300 mg total) by mouth 3 (three) times daily.   Vitamin D (Ergocalciferol) 1.25 MG (50000 UNIT) Caps capsule Commonly known as: DRISDOL Take 1 capsule (50,000 Units total) by mouth every 7 (seven) days. Take for 8 total doses(weeks)        Allergies:  Allergies  Allergen Reactions   Ferrous Sulfate Hives    Repliva OK   Sulfonamide Derivatives Hives   Latex Hives, Itching and Rash    Itching and rash when wearing latex gloves    Past Medical History, Surgical history, Social history, and Family History were reviewed and updated.  Review of Systems: All other 10 point review of systems is negative.   Physical Exam:  weight is 199 lb 12.8 oz (90.6 kg). Her oral temperature is 98.2 F (36.8 C). Her blood pressure is 125/85 and her pulse is 87. Her respiration is 17 and oxygen saturation is 100%.   Wt Readings from Last 3 Encounters:  08/27/23 199 lb 12.8 oz (90.6 kg)  06/27/23 197 lb (89.4 kg)  06/18/23 199 lb (90.3 kg)    Ocular: Sclerae unicteric, pupils equal, round and reactive to light Ear-nose-throat: Oropharynx clear, dentition fair Lymphatic: No cervical or supraclavicular adenopathy Lungs no rales or rhonchi, good excursion bilaterally Heart  regular rate and rhythm, no murmur appreciated Abd soft, nontender, positive bowel sounds MSK no focal spinal tenderness, no joint edema Neuro: non-focal, well-oriented, appropriate affect Breasts: Deferred   Lab Results  Component Value Date   WBC 11.4 (H) 08/27/2023   HGB 11.7 (L) 08/27/2023   HCT 37.6 08/27/2023   MCV 80.5 08/27/2023   PLT 387 08/27/2023   Lab Results  Component Value Date   FERRITIN 5 (L) 06/04/2023   IRON 15 (L) 06/04/2023   TIBC 380 06/04/2023   UIBC 365 06/04/2023   IRONPCTSAT 4 (LL) 06/04/2023   Lab Results  Component Value Date   RETICCTPCT 1.1 08/27/2023   RBC 4.68  08/27/2023   RETICCTABS 60,580 01/29/2017   No results found for: "KPAFRELGTCHN", "LAMBDASER", "KAPLAMBRATIO" No results found for: "IGGSERUM", "IGA", "IGMSERUM" No results found for: "TOTALPROTELP", "ALBUMINELP", "A1GS", "A2GS", "BETS", "BETA2SER", "GAMS", "MSPIKE", "SPEI"   Chemistry      Component Value Date/Time   NA 139 06/04/2023 0913   K 4.4 06/04/2023 0913   CL 106 06/04/2023 0913   CO2 22 06/04/2023 0913   BUN 12 06/04/2023 0913   CREATININE 0.71 06/04/2023 0913   CREATININE 0.69 06/03/2022 0000      Component Value Date/Time   CALCIUM 9.0 06/04/2023 0913   ALKPHOS 70 06/04/2023 0913   AST 9 06/04/2023 0913   ALT 7 06/04/2023 0913   BILITOT 0.2 06/04/2023 0913       Impression and Plan: Cathy Daniels is a very pleasant 37 yo female with long history of IDA secondary to heavy cycles. Iron studies are pending. We will replace if needed.  Follow-up in 3 months.    Eileen Stanford, NP 9/4/20241:22 PM

## 2023-08-28 ENCOUNTER — Telehealth: Payer: Self-pay | Admitting: *Deleted

## 2023-08-28 NOTE — Telephone Encounter (Signed)
Per scheduling message Maralyn Sago - Called patient and lvm for a call back to schedule (3) doses of Iv Iron.

## 2023-09-02 ENCOUNTER — Telehealth: Payer: Self-pay | Admitting: *Deleted

## 2023-09-02 NOTE — Telephone Encounter (Signed)
Per scheduling message Sarah - called and lvm for a callback to schedule (3) doses of IV Iron 

## 2023-09-03 ENCOUNTER — Other Ambulatory Visit (HOSPITAL_COMMUNITY): Payer: Self-pay

## 2023-09-03 ENCOUNTER — Other Ambulatory Visit: Payer: Self-pay

## 2023-09-03 ENCOUNTER — Telehealth: Payer: Self-pay | Admitting: *Deleted

## 2023-09-03 NOTE — Telephone Encounter (Signed)
Per scheduling message Maralyn Sago - called again and lvm for a call back to schedule (3) doses of IV Iron.

## 2023-09-09 ENCOUNTER — Encounter: Payer: Self-pay | Admitting: Family

## 2023-09-15 ENCOUNTER — Encounter: Payer: Self-pay | Admitting: Family

## 2023-09-17 ENCOUNTER — Inpatient Hospital Stay: Payer: 59

## 2023-09-17 VITALS — BP 119/76 | HR 85 | Temp 98.5°F | Resp 18

## 2023-09-17 DIAGNOSIS — D508 Other iron deficiency anemias: Secondary | ICD-10-CM

## 2023-09-17 DIAGNOSIS — N920 Excessive and frequent menstruation with regular cycle: Secondary | ICD-10-CM | POA: Diagnosis not present

## 2023-09-17 DIAGNOSIS — D500 Iron deficiency anemia secondary to blood loss (chronic): Secondary | ICD-10-CM | POA: Diagnosis not present

## 2023-09-17 MED ORDER — SODIUM CHLORIDE 0.9 % IV SOLN: Freq: Once | INTRAVENOUS | Status: AC

## 2023-09-17 MED ORDER — SODIUM CHLORIDE 0.9 % IV SOLN
300.0000 mg | Freq: Once | INTRAVENOUS | Status: AC
  Administered 2023-09-17: 300 mg via INTRAVENOUS
  Filled 2023-09-17: qty 300

## 2023-09-17 NOTE — Patient Instructions (Signed)
Iron Sucrose Injection What is this medication? IRON SUCROSE (EYE ern SOO krose) treats low levels of iron (iron deficiency anemia) in people with kidney disease. Iron is a mineral that plays an important role in making red blood cells, which carry oxygen from your lungs to the rest of your body. This medicine may be used for other purposes; ask your health care provider or pharmacist if you have questions. COMMON BRAND NAME(S): Venofer What should I tell my care team before I take this medication? They need to know if you have any of these conditions: Anemia not caused by low iron levels Heart disease High levels of iron in the blood Kidney disease Liver disease An unusual or allergic reaction to iron, other medications, foods, dyes, or preservatives Pregnant or trying to get pregnant Breastfeeding How should I use this medication? This medication is for infusion into a vein. It is given in a hospital or clinic setting. Talk to your care team about the use of this medication in children. While this medication may be prescribed for children as young as 2 years for selected conditions, precautions do apply. Overdosage: If you think you have taken too much of this medicine contact a poison control center or emergency room at once. NOTE: This medicine is only for you. Do not share this medicine with others. What if I miss a dose? Keep appointments for follow-up doses. It is important not to miss your dose. Call your care team if you are unable to keep an appointment. What may interact with this medication? Do not take this medication with any of the following: Deferoxamine Dimercaprol Other iron products This medication may also interact with the following: Chloramphenicol Deferasirox This list may not describe all possible interactions. Give your health care provider a list of all the medicines, herbs, non-prescription drugs, or dietary supplements you use. Also tell them if you smoke,  drink alcohol, or use illegal drugs. Some items may interact with your medicine. What should I watch for while using this medication? Visit your care team regularly. Tell your care team if your symptoms do not start to get better or if they get worse. You may need blood work done while you are taking this medication. You may need to follow a special diet. Talk to your care team. Foods that contain iron include: whole grains/cereals, dried fruits, beans, or peas, leafy green vegetables, and organ meats (liver, kidney). What side effects may I notice from receiving this medication? Side effects that you should report to your care team as soon as possible: Allergic reactions--skin rash, itching, hives, swelling of the face, lips, tongue, or throat Low blood pressure--dizziness, feeling faint or lightheaded, blurry vision Shortness of breath Side effects that usually do not require medical attention (report to your care team if they continue or are bothersome): Flushing Headache Joint pain Muscle pain Nausea Pain, redness, or irritation at injection site This list may not describe all possible side effects. Call your doctor for medical advice about side effects. You may report side effects to FDA at 1-800-FDA-1088. Where should I keep my medication? This medication is given in a hospital or clinic. It will not be stored at home. NOTE: This sheet is a summary. It may not cover all possible information. If you have questions about this medicine, talk to your doctor, pharmacist, or health care provider.  2024 Elsevier/Gold Standard (2023-05-16 00:00:00)

## 2023-09-24 ENCOUNTER — Inpatient Hospital Stay: Payer: 59 | Attending: Family

## 2023-09-24 VITALS — BP 117/86 | HR 73 | Temp 98.6°F | Resp 16

## 2023-09-24 DIAGNOSIS — D500 Iron deficiency anemia secondary to blood loss (chronic): Secondary | ICD-10-CM | POA: Insufficient documentation

## 2023-09-24 DIAGNOSIS — N920 Excessive and frequent menstruation with regular cycle: Secondary | ICD-10-CM | POA: Insufficient documentation

## 2023-09-24 DIAGNOSIS — D508 Other iron deficiency anemias: Secondary | ICD-10-CM

## 2023-09-24 MED ORDER — SODIUM CHLORIDE 0.9 % IV SOLN: Freq: Once | INTRAVENOUS | Status: AC

## 2023-09-24 MED ORDER — SODIUM CHLORIDE 0.9 % IV SOLN
300.0000 mg | Freq: Once | INTRAVENOUS | Status: AC
  Administered 2023-09-24: 300 mg via INTRAVENOUS
  Filled 2023-09-24: qty 300

## 2023-09-24 NOTE — Patient Instructions (Signed)
Iron Sucrose Injection What is this medication? IRON SUCROSE (EYE ern SOO krose) treats low levels of iron (iron deficiency anemia) in people with kidney disease. Iron is a mineral that plays an important role in making red blood cells, which carry oxygen from your lungs to the rest of your body. This medicine may be used for other purposes; ask your health care provider or pharmacist if you have questions. COMMON BRAND NAME(S): Venofer What should I tell my care team before I take this medication? They need to know if you have any of these conditions: Anemia not caused by low iron levels Heart disease High levels of iron in the blood Kidney disease Liver disease An unusual or allergic reaction to iron, other medications, foods, dyes, or preservatives Pregnant or trying to get pregnant Breastfeeding How should I use this medication? This medication is for infusion into a vein. It is given in a hospital or clinic setting. Talk to your care team about the use of this medication in children. While this medication may be prescribed for children as young as 2 years for selected conditions, precautions do apply. Overdosage: If you think you have taken too much of this medicine contact a poison control center or emergency room at once. NOTE: This medicine is only for you. Do not share this medicine with others. What if I miss a dose? Keep appointments for follow-up doses. It is important not to miss your dose. Call your care team if you are unable to keep an appointment. What may interact with this medication? Do not take this medication with any of the following: Deferoxamine Dimercaprol Other iron products This medication may also interact with the following: Chloramphenicol Deferasirox This list may not describe all possible interactions. Give your health care provider a list of all the medicines, herbs, non-prescription drugs, or dietary supplements you use. Also tell them if you smoke,  drink alcohol, or use illegal drugs. Some items may interact with your medicine. What should I watch for while using this medication? Visit your care team regularly. Tell your care team if your symptoms do not start to get better or if they get worse. You may need blood work done while you are taking this medication. You may need to follow a special diet. Talk to your care team. Foods that contain iron include: whole grains/cereals, dried fruits, beans, or peas, leafy green vegetables, and organ meats (liver, kidney). What side effects may I notice from receiving this medication? Side effects that you should report to your care team as soon as possible: Allergic reactions--skin rash, itching, hives, swelling of the face, lips, tongue, or throat Low blood pressure--dizziness, feeling faint or lightheaded, blurry vision Shortness of breath Side effects that usually do not require medical attention (report to your care team if they continue or are bothersome): Flushing Headache Joint pain Muscle pain Nausea Pain, redness, or irritation at injection site This list may not describe all possible side effects. Call your doctor for medical advice about side effects. You may report side effects to FDA at 1-800-FDA-1088. Where should I keep my medication? This medication is given in a hospital or clinic. It will not be stored at home. NOTE: This sheet is a summary. It may not cover all possible information. If you have questions about this medicine, talk to your doctor, pharmacist, or health care provider.  2024 Elsevier/Gold Standard (2023-05-16 00:00:00)

## 2023-10-01 ENCOUNTER — Inpatient Hospital Stay: Payer: 59

## 2023-10-01 VITALS — BP 127/87 | HR 73 | Temp 97.6°F | Resp 18

## 2023-10-01 DIAGNOSIS — D508 Other iron deficiency anemias: Secondary | ICD-10-CM

## 2023-10-01 DIAGNOSIS — N920 Excessive and frequent menstruation with regular cycle: Secondary | ICD-10-CM | POA: Diagnosis not present

## 2023-10-01 DIAGNOSIS — D500 Iron deficiency anemia secondary to blood loss (chronic): Secondary | ICD-10-CM | POA: Diagnosis not present

## 2023-10-01 MED ORDER — SODIUM CHLORIDE 0.9 % IV SOLN: Freq: Once | INTRAVENOUS | Status: AC

## 2023-10-01 MED ORDER — SODIUM CHLORIDE 0.9 % IV SOLN
300.0000 mg | Freq: Once | INTRAVENOUS | Status: AC
  Administered 2023-10-01: 300 mg via INTRAVENOUS
  Filled 2023-10-01: qty 200

## 2023-10-01 NOTE — Progress Notes (Signed)
Patient refused to wait 30 minutes post infusion. Released stable and ASX. 

## 2023-10-01 NOTE — Patient Instructions (Signed)
Iron Sucrose Injection What is this medication? IRON SUCROSE (EYE ern SOO krose) treats low levels of iron (iron deficiency anemia) in people with kidney disease. Iron is a mineral that plays an important role in making red blood cells, which carry oxygen from your lungs to the rest of your body. This medicine may be used for other purposes; ask your health care provider or pharmacist if you have questions. COMMON BRAND NAME(S): Venofer What should I tell my care team before I take this medication? They need to know if you have any of these conditions: Anemia not caused by low iron levels Heart disease High levels of iron in the blood Kidney disease Liver disease An unusual or allergic reaction to iron, other medications, foods, dyes, or preservatives Pregnant or trying to get pregnant Breastfeeding How should I use this medication? This medication is for infusion into a vein. It is given in a hospital or clinic setting. Talk to your care team about the use of this medication in children. While this medication may be prescribed for children as young as 2 years for selected conditions, precautions do apply. Overdosage: If you think you have taken too much of this medicine contact a poison control center or emergency room at once. NOTE: This medicine is only for you. Do not share this medicine with others. What if I miss a dose? Keep appointments for follow-up doses. It is important not to miss your dose. Call your care team if you are unable to keep an appointment. What may interact with this medication? Do not take this medication with any of the following: Deferoxamine Dimercaprol Other iron products This medication may also interact with the following: Chloramphenicol Deferasirox This list may not describe all possible interactions. Give your health care provider a list of all the medicines, herbs, non-prescription drugs, or dietary supplements you use. Also tell them if you smoke,  drink alcohol, or use illegal drugs. Some items may interact with your medicine. What should I watch for while using this medication? Visit your care team regularly. Tell your care team if your symptoms do not start to get better or if they get worse. You may need blood work done while you are taking this medication. You may need to follow a special diet. Talk to your care team. Foods that contain iron include: whole grains/cereals, dried fruits, beans, or peas, leafy green vegetables, and organ meats (liver, kidney). What side effects may I notice from receiving this medication? Side effects that you should report to your care team as soon as possible: Allergic reactions--skin rash, itching, hives, swelling of the face, lips, tongue, or throat Low blood pressure--dizziness, feeling faint or lightheaded, blurry vision Shortness of breath Side effects that usually do not require medical attention (report to your care team if they continue or are bothersome): Flushing Headache Joint pain Muscle pain Nausea Pain, redness, or irritation at injection site This list may not describe all possible side effects. Call your doctor for medical advice about side effects. You may report side effects to FDA at 1-800-FDA-1088. Where should I keep my medication? This medication is given in a hospital or clinic. It will not be stored at home. NOTE: This sheet is a summary. It may not cover all possible information. If you have questions about this medicine, talk to your doctor, pharmacist, or health care provider.  2024 Elsevier/Gold Standard (2023-05-16 00:00:00)

## 2023-10-13 ENCOUNTER — Other Ambulatory Visit (HOSPITAL_COMMUNITY): Payer: Self-pay

## 2023-10-20 ENCOUNTER — Other Ambulatory Visit (HOSPITAL_COMMUNITY): Payer: Self-pay

## 2023-10-20 ENCOUNTER — Encounter: Payer: Self-pay | Admitting: Physician Assistant

## 2023-10-20 ENCOUNTER — Other Ambulatory Visit: Payer: Self-pay

## 2023-10-20 ENCOUNTER — Ambulatory Visit (INDEPENDENT_AMBULATORY_CARE_PROVIDER_SITE_OTHER): Payer: 59 | Admitting: Physician Assistant

## 2023-10-20 VITALS — BP 137/88 | HR 88 | Temp 98.3°F | Ht 63.5 in | Wt 198.0 lb

## 2023-10-20 DIAGNOSIS — W19XXXA Unspecified fall, initial encounter: Secondary | ICD-10-CM

## 2023-10-20 DIAGNOSIS — M25562 Pain in left knee: Secondary | ICD-10-CM

## 2023-10-20 DIAGNOSIS — T7840XA Allergy, unspecified, initial encounter: Secondary | ICD-10-CM

## 2023-10-20 DIAGNOSIS — L739 Follicular disorder, unspecified: Secondary | ICD-10-CM | POA: Diagnosis not present

## 2023-10-20 MED ORDER — NAPROXEN 500 MG PO TABS
500.0000 mg | ORAL_TABLET | Freq: Two times a day (BID) | ORAL | 1 refills | Status: AC
Start: 2023-10-20 — End: ?
  Filled 2023-10-20: qty 180, 90d supply, fill #0

## 2023-10-20 MED ORDER — DOXYCYCLINE HYCLATE 100 MG PO TABS
100.0000 mg | ORAL_TABLET | Freq: Two times a day (BID) | ORAL | 0 refills | Status: DC
Start: 2023-10-20 — End: 2024-01-05

## 2023-10-20 MED ORDER — METHYLPREDNISOLONE 4 MG PO TBPK
ORAL_TABLET | ORAL | 0 refills | Status: DC
Start: 2023-10-20 — End: 2024-01-05

## 2023-10-21 ENCOUNTER — Encounter: Payer: Self-pay | Admitting: Physician Assistant

## 2023-10-21 DIAGNOSIS — T7840XA Allergy, unspecified, initial encounter: Secondary | ICD-10-CM | POA: Insufficient documentation

## 2023-10-21 DIAGNOSIS — M25562 Pain in left knee: Secondary | ICD-10-CM | POA: Insufficient documentation

## 2023-10-21 NOTE — Progress Notes (Signed)
Acute Office Visit  Subjective:     Patient ID: Cathy Daniels, female    DOB: 12-May-1986, 37 y.o.   MRN: [id number]  No chief complaint on file.   HPI Patient is in today for allergic reaction after using a dye on her hair this weekend. She used the dye and went under heat and noticed some irritation of scalp with tiny bumps everywhere and even some swelling of her upper left eye lid. She denies any problems breathing. Scalp is tender and itchy. It has started draining some fluid as well from the bumps. No fever, chills, body aches. Not taken anything to make better.   She also tripped over her dress on Sunday and landed on her left knee. It is achy and painful. Ibuprofen seemed to help a lot. No problems walking or with ROM.   [name]. Active Ambulatory Problems    Diagnosis Date Noted   Allergic rhinitis 03/30/2010   Asthma, moderate persistent 04/19/2008   Vaginal delivery 12/17/2013   Hypothyroidism 01/28/2017   History of prediabetes 01/28/2017   History of gestational diabetes 01/28/2017   Irregular periods 01/28/2017   Eczema of both hands 01/28/2017   Situational anxiety 01/28/2017   History of asthma 01/28/2017   Menstrual cramp 01/28/2017   Vitamin D deficiency 02/03/2017   Diabetes mellitus affecting pregnancy in third trimester 12/22/2018   NSVD (normal spontaneous vaginal delivery) 12/23/2018   Pre-diabetes 06/03/2022   Iron deficiency anemia secondary to inadequate dietary iron intake 06/03/2022   Elevated blood pressure reading 06/03/2022   Newly recognized heart murmur 06/03/2022   Class 1 obesity due to excess calories without serious comorbidity with body mass index (BMI) of 34.0 to 34.9 in adult 06/03/2022   Low serum vitamin B12 06/04/2022   Stress 09/11/2022   Adjustment disorder with anxiety 09/19/2022   Ear fullness, bilateral 04/11/2023   Tinnitus of both ears 04/11/2023   Seasonal allergies 04/11/2023   ETD (Eustachian tube dysfunction), bilateral  04/11/2023   Systolic murmur 06/18/2023   Acute pain of left knee 10/21/2023   Allergic reaction 10/21/2023   Resolved Ambulatory Problems    Diagnosis Date Noted   Rash and other nonspecific skin eruption 03/30/2010   Pregnancy 12/17/2013   Past Medical History:  Diagnosis Date   Abnormal pap 2007, 2008   Anemia    Asthma    Genital warts    GERD (gastroesophageal reflux disease)    Gestational diabetes    Hypothyroid    Infection 2017   Pregnancy induced hypertension    Vaginal Pap smear, abnormal      ROS See HPI.      Objective:    BP 137/88   Pulse 88   Temp 98.3 F (36.8 C)   Ht 5' 3.5" (1.613 m)   Wt 198 lb (89.8 kg)   SpO2 100%   BMI 34.52 kg/m  BP Readings from Last 3 Encounters:  10/20/23 137/88  10/01/23 127/87  09/24/23 117/86   Wt Readings from Last 3 Encounters:  10/20/23 198 lb (89.8 kg)  08/27/23 199 lb 12.8 oz (90.6 kg)  06/27/23 197 lb (89.4 kg)      Physical Exam Constitutional:      Appearance: Normal appearance.  HENT:     Head:     Comments: Many inflamed hair follicles, some pus filled, over scalp. Mostly concentrated frontal area but some occipital as well.     Mouth/Throat:     Mouth: Mucous membranes are moist.  Cardiovascular:  Rate and Rhythm: Normal rate and regular rhythm.  Pulmonary:     Effort: Pulmonary effort is normal.  Musculoskeletal:     Cervical back: Normal range of motion and neck supple. No tenderness.     Comments: Left knee a little swollen and tender to palpation on the lateral side. Normal ROM and 5/5 Strength.     Lymphadenopathy:     Cervical: No cervical adenopathy.  Skin:    Comments: Upper left eyelid swollen.   Neurological:     General: No focal deficit present.     Mental Status: She is alert and oriented to person, place, and time.  Psychiatric:        Mood and Affect: Mood normal.          Assessment & Plan:  Marland KitchenMarland KitchenDiagnoses and all orders for this visit:  Folliculitis -      doxycycline (VIBRA-TABS) 100 MG tablet; Take 1 tablet (100 mg total) by mouth 2 (two) times daily.  Fall, initial encounter  Acute pain of left knee -     naproxen (NAPROSYN) 500 MG tablet; Take 1 tablet (500 mg total) by mouth 2 (two) times daily with a meal. Take every day for one week, then use as needed after that  Allergic reaction, initial encounter -     methylPREDNISolone (MEDROL DOSEPAK) 4 MG TBPK tablet; Take as directed by package insert.   Avoid the hair dye due to allergic reaction-start medrol dose pk Doxycycline given for folliculitis  Use naproxen as needed after the prednisone for knee pain and other pain/inflammation Ice knee No concerns on exam for fracture, appears like contusion and will take a few days to heal if not improving or worsening will get xray   Tandy Gaw, [name]

## 2023-11-21 ENCOUNTER — Other Ambulatory Visit: Payer: Self-pay

## 2023-11-21 ENCOUNTER — Other Ambulatory Visit: Payer: Self-pay | Admitting: Physician Assistant

## 2023-11-25 ENCOUNTER — Other Ambulatory Visit (HOSPITAL_COMMUNITY): Payer: Self-pay

## 2023-11-25 ENCOUNTER — Other Ambulatory Visit: Payer: Self-pay

## 2023-11-25 MED ORDER — OZEMPIC (2 MG/DOSE) 8 MG/3ML ~~LOC~~ SOPN
2.0000 mg | PEN_INJECTOR | SUBCUTANEOUS | 0 refills | Status: DC
Start: 2023-11-25 — End: 2024-03-11
  Filled 2023-11-25: qty 9, 84d supply, fill #0

## 2023-11-26 ENCOUNTER — Other Ambulatory Visit: Payer: Self-pay

## 2023-11-26 ENCOUNTER — Other Ambulatory Visit (HOSPITAL_COMMUNITY): Payer: Self-pay

## 2023-11-26 ENCOUNTER — Encounter (HOSPITAL_COMMUNITY): Payer: Self-pay | Admitting: Pharmacist

## 2023-12-22 LAB — HM DIABETES EYE EXAM

## 2024-01-02 ENCOUNTER — Ambulatory Visit: Payer: 59 | Admitting: Family

## 2024-01-02 ENCOUNTER — Inpatient Hospital Stay: Payer: Self-pay

## 2024-01-05 ENCOUNTER — Other Ambulatory Visit (HOSPITAL_COMMUNITY): Payer: Self-pay

## 2024-01-05 ENCOUNTER — Encounter: Payer: Self-pay | Admitting: Physician Assistant

## 2024-01-05 ENCOUNTER — Encounter (HOSPITAL_COMMUNITY): Payer: Self-pay

## 2024-01-05 ENCOUNTER — Encounter: Payer: Self-pay | Admitting: Family

## 2024-01-05 ENCOUNTER — Telehealth: Payer: Commercial Managed Care - PPO | Admitting: Physician Assistant

## 2024-01-05 VITALS — BP 136/80 | HR 97 | Temp 97.5°F

## 2024-01-05 DIAGNOSIS — J329 Chronic sinusitis, unspecified: Secondary | ICD-10-CM

## 2024-01-05 DIAGNOSIS — J40 Bronchitis, not specified as acute or chronic: Secondary | ICD-10-CM | POA: Diagnosis not present

## 2024-01-05 MED ORDER — METHYLPREDNISOLONE 4 MG PO TBPK
ORAL_TABLET | ORAL | 0 refills | Status: DC
Start: 2024-01-05 — End: 2024-01-05
  Filled 2024-01-05: qty 21, fill #0

## 2024-01-05 MED ORDER — AZITHROMYCIN 250 MG PO TABS
ORAL_TABLET | ORAL | 0 refills | Status: DC
  Filled 2024-01-05: qty 6, 5d supply, fill #0

## 2024-01-05 MED ORDER — METHYLPREDNISOLONE 4 MG PO TBPK
ORAL_TABLET | ORAL | 0 refills | Status: DC
  Filled 2024-01-05: qty 21, 6d supply, fill #0

## 2024-01-05 NOTE — Progress Notes (Signed)
 ..Virtual Visit via Video Note  I connected with Cathy Daniels on 01/05/24 at  1:00 PM EST by a video enabled telemedicine application and verified that I am speaking with the correct person using two identifiers.  Location: Patient: home Provider: clinic  .SABRAParticipating in visit:  Patient: Cathy Daniels Provider: Vermell Bologna [name] Provider in training: Vernell Gate PA-S   I discussed the limitations of evaluation and management by telemedicine and the availability of in person appointments. The patient expressed understanding and agreed to proceed.  History of Present Illness: Pt is a 38 yo female who calls into the clinic with URI symptoms for the last 10 days. Her family  has had similar symptoms but all after her. She felt like she was getting better and then started getting worse again. Hx of asthma. She has been wheezing, coughing, having chest tightness. She has felt hot and cold with body aches. She has sinus symptoms with drainage. She has been using OTC medications with little benefit. She tested negative for covid and flu.   .. Active Ambulatory Problems    Diagnosis Date Noted   Allergic rhinitis 03/30/2010   Asthma, moderate persistent 04/19/2008   Vaginal delivery 12/17/2013   Hypothyroidism 01/28/2017   History of prediabetes 01/28/2017   History of gestational diabetes 01/28/2017   Irregular periods 01/28/2017   Eczema of both hands 01/28/2017   Situational anxiety 01/28/2017   History of asthma 01/28/2017   Menstrual cramp 01/28/2017   Vitamin D  deficiency 02/03/2017   Diabetes mellitus affecting pregnancy in third trimester 12/22/2018   NSVD (normal spontaneous vaginal delivery) 12/23/2018   Pre-diabetes 06/03/2022   Iron  deficiency anemia secondary to inadequate dietary iron  intake 06/03/2022   Elevated blood pressure reading 06/03/2022   Newly recognized heart murmur 06/03/2022   Class 1 obesity due to excess calories without serious comorbidity with body  mass index (BMI) of 34.0 to 34.9 in adult 06/03/2022   Low serum vitamin B12 06/04/2022   Stress 09/11/2022   Adjustment disorder with anxiety 09/19/2022   Ear fullness, bilateral 04/11/2023   Tinnitus of both ears 04/11/2023   Seasonal allergies 04/11/2023   ETD (Eustachian tube dysfunction), bilateral 04/11/2023   Systolic murmur 06/18/2023   Acute pain of left knee 10/21/2023   Allergic reaction 10/21/2023   Resolved Ambulatory Problems    Diagnosis Date Noted   Rash and other nonspecific skin eruption 03/30/2010   Pregnancy 12/17/2013   Past Medical History:  Diagnosis Date   Abnormal pap 2007, 2008   Anemia    Asthma    Genital warts    GERD (gastroesophageal reflux disease)    Gestational diabetes    Hypothyroid    Infection 2017   Pregnancy induced hypertension    Vaginal Pap smear, abnormal        Observations/Objective: No acute distress Normal mood and appearance Normal breathing  .SABRA Today's Vitals   01/05/24 1315  BP: 136/80  Pulse: 97  Temp: (!) 97.5 F (36.4 C)  TempSrc: Oral   There is no height or weight on file to calculate BMI.   Assessment and Plan: SABRASABRADiagnoses and all orders for this visit:  Sinobronchitis -     azithromycin  (ZITHROMAX  Z-PAK) 250 MG tablet; Take 2 tablets (500 mg) on  Day 1,  followed by 1 tablet (250 mg) once daily on Days 2 through 5. -     methylPREDNISolone  (MEDROL  DOSEPAK) 4 MG TBPK tablet; Take as directed by package insert.  Other orders -  Discontinue: methylPREDNISolone  (MEDROL  DOSEPAK) 4 MG TBPK tablet; Take as directed by package insert.   URI into sinus infection and bronchitis Continue with OTC cough medication Start zpak and medrol  dose pack Continue on advair  Use albuterol  as needed every 2-4 hours Rest and hydrate Follow up if not improving or symptoms worsening   Follow Up Instructions:    I discussed the assessment and treatment plan with the patient. The patient was provided an  opportunity to ask questions and all were answered. The patient agreed with the plan and demonstrated an understanding of the instructions.   The patient was advised to call back or seek an in-person evaluation if the symptoms worsen or if the condition fails to improve as anticipated.    Micheline Markes, [name]

## 2024-01-07 ENCOUNTER — Other Ambulatory Visit (HOSPITAL_COMMUNITY): Payer: Self-pay

## 2024-01-12 ENCOUNTER — Other Ambulatory Visit: Payer: Commercial Managed Care - PPO

## 2024-01-12 ENCOUNTER — Encounter: Payer: Self-pay | Admitting: Family

## 2024-01-13 ENCOUNTER — Other Ambulatory Visit: Payer: Commercial Managed Care - PPO

## 2024-01-13 ENCOUNTER — Encounter: Payer: Self-pay | Admitting: Physician Assistant

## 2024-01-14 MED ORDER — ATROVENT HFA 17 MCG/ACT IN AERS
2.0000 | INHALATION_SPRAY | RESPIRATORY_TRACT | 3 refills | Status: DC | PRN
Start: 2024-01-14 — End: 2024-02-02

## 2024-01-14 MED ORDER — DOXYCYCLINE HYCLATE 100 MG PO TABS: 100.0000 mg | ORAL_TABLET | Freq: Two times a day (BID) | ORAL | 0 refills | Status: DC

## 2024-01-14 NOTE — Telephone Encounter (Signed)
Just finished zpak and prednisone and got better and symptoms came right back.   Will send atrovent and doxycycline.

## 2024-01-16 ENCOUNTER — Encounter: Payer: Self-pay | Admitting: Family

## 2024-01-16 ENCOUNTER — Inpatient Hospital Stay: Payer: Commercial Managed Care - PPO | Attending: Family | Admitting: Family

## 2024-01-16 ENCOUNTER — Inpatient Hospital Stay: Payer: Commercial Managed Care - PPO

## 2024-01-16 ENCOUNTER — Ambulatory Visit: Payer: 59 | Admitting: Family

## 2024-01-16 VITALS — BP 126/92 | HR 82 | Temp 97.9°F | Resp 19 | Wt 196.0 lb

## 2024-01-16 DIAGNOSIS — N920 Excessive and frequent menstruation with regular cycle: Secondary | ICD-10-CM | POA: Insufficient documentation

## 2024-01-16 DIAGNOSIS — D500 Iron deficiency anemia secondary to blood loss (chronic): Secondary | ICD-10-CM | POA: Diagnosis not present

## 2024-01-16 DIAGNOSIS — D508 Other iron deficiency anemias: Secondary | ICD-10-CM

## 2024-01-16 LAB — CBC WITH DIFFERENTIAL (CANCER CENTER ONLY)
Abs Immature Granulocytes: 0.11 10*3/uL — ABNORMAL HIGH (ref 0.00–0.07)
Basophils Absolute: 0 10*3/uL (ref 0.0–0.1)
Basophils Relative: 0 %
Eosinophils Absolute: 0.3 10*3/uL (ref 0.0–0.5)
Eosinophils Relative: 4 %
HCT: 39.2 % (ref 36.0–46.0)
Hemoglobin: 12.7 g/dL (ref 12.0–15.0)
Immature Granulocytes: 1 %
Lymphocytes Relative: 31 %
Lymphs Abs: 3 10*3/uL (ref 0.7–4.0)
MCH: 28 pg (ref 26.0–34.0)
MCHC: 32.4 g/dL (ref 30.0–36.0)
MCV: 86.5 fL (ref 80.0–100.0)
Monocytes Absolute: 0.6 10*3/uL (ref 0.1–1.0)
Monocytes Relative: 6 %
Neutro Abs: 5.8 10*3/uL (ref 1.7–7.7)
Neutrophils Relative %: 58 %
Platelet Count: 379 10*3/uL (ref 150–400)
RBC: 4.53 MIL/uL (ref 3.87–5.11)
RDW: 13.2 % (ref 11.5–15.5)
WBC Count: 9.8 10*3/uL (ref 4.0–10.5)
nRBC: 0 % (ref 0.0–0.2)

## 2024-01-16 LAB — IRON AND IRON BINDING CAPACITY (CC-WL,HP ONLY)
Iron: 56 ug/dL (ref 28–170)
Saturation Ratios: 16 % (ref 10.4–31.8)
TIBC: 356 ug/dL (ref 250–450)
UIBC: 300 ug/dL (ref 148–442)

## 2024-01-16 LAB — RETICULOCYTES
Immature Retic Fract: 4.9 % (ref 2.3–15.9)
RBC.: 4.6 MIL/uL (ref 3.87–5.11)
Retic Count, Absolute: 53.8 10*3/uL (ref 19.0–186.0)
Retic Ct Pct: 1.2 % (ref 0.4–3.1)

## 2024-01-16 LAB — FERRITIN: Ferritin: 158 ng/mL (ref 11–307)

## 2024-01-16 NOTE — Progress Notes (Signed)
Hematology and Oncology Follow Up Visit  Bret Stamour [id number] Jan 01, 1986 38 y.o. 01/16/2024   Principle Diagnosis:  Iron deficiency anemia secondary to heavy cycle   Current Therapy:        IV iron as indicated    Interim History:  Ms. Nason is here today for follow-up. She is doing well and feeling much better since receiving IV iron.  Hgb is improved at 12.7, MCV 86.  No blood loss noted. No bruising or petechiae.  She is getting over an upper respiratory infections and currently on Doxycycline as well as Mucinex for cough.  No fever, chills, n/v, rash, dizziness, SOB, chest pain, palpitations, abdominal pain or changes in bowel or bladder habits.  No swelling, tenderness, numbness or tingling in her extremities.  No falls or syncope.  Appetite and hydration are good. Weight is stable at 191 lbs.   ECOG Performance Status: 1 - Symptomatic but completely ambulatory  Medications:  Allergies as of 01/16/2024       Reactions   Ferrous Sulfate Hives   Repliva OK   Sulfonamide Derivatives Hives   Latex Hives, Itching, Rash   Itching and rash when wearing latex gloves        Medication List        Accurate as of January 16, 2024  1:33 PM. If you have any questions, ask your nurse or doctor.          Advair Diskus 250-50 MCG/ACT Aepb Generic drug: fluticasone-salmeterol Inhale 1 puff into the lungs in the morning and at bedtime.   Atrovent HFA 17 MCG/ACT inhaler Generic drug: ipratropium Inhale 2 puffs into the lungs every 4 (four) hours as needed for wheezing.   azithromycin 250 MG tablet Commonly known as: Zithromax Z-Pak Take 2 tablets (500 mg) on  Day 1,  followed by 1 tablet (250 mg) once daily on Days 2 through 5.   doxycycline 100 MG tablet Commonly known as: VIBRA-TABS Take 1 tablet (100 mg total) by mouth 2 (two) times daily.   fluticasone 50 MCG/ACT nasal spray Commonly known as: FLONASE Place 2 sprays into both nostrils daily.   levothyroxine  112 MCG tablet Commonly known as: SYNTHROID Take 1 tablet (112 mcg total) by mouth every morning on an empty stomach.   methylPREDNISolone 4 MG Tbpk tablet Commonly known as: MEDROL DOSEPAK Take as directed by package insert.   naproxen 500 MG tablet Commonly known as: NAPROSYN Take 1 tablet (500 mg total) by mouth 2 (two) times daily with a meal. Take every day for one week, then use as needed after that   Ozempic (2 MG/DOSE) 8 MG/3ML Sopn Generic drug: Semaglutide (2 MG/DOSE) Inject 2 mg as directed once a week.   Vitamin D (Ergocalciferol) 1.25 MG (50000 UNIT) Caps capsule Commonly known as: DRISDOL Take 1 capsule (50,000 Units total) by mouth every 7 (seven) days. Take for 8 total doses(weeks)        Allergies:  Allergies  Allergen Reactions   Ferrous Sulfate Hives    Repliva OK   Sulfonamide Derivatives Hives   Latex Hives, Itching and Rash    Itching and rash when wearing latex gloves    Past Medical History, Surgical history, Social history, and Family History were reviewed and updated.  Review of Systems: All other 10 point review of systems is negative.   Physical Exam:  vitals were not taken for this visit.   Wt Readings from Last 3 Encounters:  10/20/23 198 lb (89.8 kg)  08/27/23 199 lb 12.8 oz (90.6 kg)  06/27/23 197 lb (89.4 kg)    Ocular: Sclerae unicteric, pupils equal, round and reactive to light Ear-nose-throat: Oropharynx clear, dentition fair Lymphatic: No cervical or supraclavicular adenopathy Lungs no rales or rhonchi, good excursion bilaterally Heart regular rate and rhythm, no murmur appreciated Abd soft, nontender, positive bowel sounds MSK no focal spinal tenderness, no joint edema Neuro: non-focal, well-oriented, appropriate affect Breasts: Deferred   Lab Results  Component Value Date   WBC 11.4 (H) 08/27/2023   HGB 11.7 (L) 08/27/2023   HCT 37.6 08/27/2023   MCV 80.5 08/27/2023   PLT 387 08/27/2023   Lab Results  Component  Value Date   FERRITIN 45 08/27/2023   IRON 32 08/27/2023   TIBC 375 08/27/2023   UIBC 343 08/27/2023   IRONPCTSAT 9 (L) 08/27/2023   Lab Results  Component Value Date   RETICCTPCT 1.1 08/27/2023   RBC 4.68 08/27/2023   RETICCTABS 60,580 01/29/2017   No results found for: "KPAFRELGTCHN", "LAMBDASER", "KAPLAMBRATIO" No results found for: "IGGSERUM", "IGA", "IGMSERUM" No results found for: "TOTALPROTELP", "ALBUMINELP", "A1GS", "A2GS", "BETS", "BETA2SER", "GAMS", "MSPIKE", "SPEI"   Chemistry      Component Value Date/Time   NA 139 06/04/2023 0913   K 4.4 06/04/2023 0913   CL 106 06/04/2023 0913   CO2 22 06/04/2023 0913   BUN 12 06/04/2023 0913   CREATININE 0.71 06/04/2023 0913   CREATININE 0.69 06/03/2022 0000      Component Value Date/Time   CALCIUM 9.0 06/04/2023 0913   ALKPHOS 70 06/04/2023 0913   AST 9 06/04/2023 0913   ALT 7 06/04/2023 0913   BILITOT 0.2 06/04/2023 0913       Impression and Plan: Ms. Winther is a very pleasant 38 yo female with long history of IDA secondary to heavy cycles. Iron studies are pending. We will replace if needed.  Follow-up in 4 months.    Eileen Stanford, NP 1/24/20251:33 PM

## 2024-02-02 ENCOUNTER — Ambulatory Visit (INDEPENDENT_AMBULATORY_CARE_PROVIDER_SITE_OTHER): Payer: Self-pay | Admitting: Obstetrics & Gynecology

## 2024-02-02 ENCOUNTER — Encounter (HOSPITAL_BASED_OUTPATIENT_CLINIC_OR_DEPARTMENT_OTHER): Payer: Self-pay | Admitting: Obstetrics & Gynecology

## 2024-02-02 VITALS — BP 137/94 | HR 80 | Ht 63.25 in | Wt 199.8 lb

## 2024-02-02 DIAGNOSIS — R7303 Prediabetes: Secondary | ICD-10-CM

## 2024-02-02 DIAGNOSIS — N920 Excessive and frequent menstruation with regular cycle: Secondary | ICD-10-CM

## 2024-02-02 DIAGNOSIS — Z862 Personal history of diseases of the blood and blood-forming organs and certain disorders involving the immune mechanism: Secondary | ICD-10-CM

## 2024-02-02 DIAGNOSIS — Z01419 Encounter for gynecological examination (general) (routine) without abnormal findings: Secondary | ICD-10-CM

## 2024-02-02 NOTE — Progress Notes (Deleted)
38 y.o. [id number] Married Other or two or more races female here for annual exam.    Patient's last menstrual period was 01/24/2024.          Sexually active: {yes no:314532}  The current method of family planning is {contraception:315051}.     The pregnancy intention screening data noted above was reviewed. Potential methods of contraception were discussed. The patient elected to proceed with No data recorded.  Exercising: {yes no:314532}  {types:19826} Smoker:  {YES [id number]  Health Maintenance: Pap:  01/21/2023 Negative History of abnormal Pap:  {YES NO:22349} MMG:  *** Colonoscopy:  *** BMD:   *** Screening Labs: ***   reports that she has never smoked. She has never used smokeless tobacco. She reports current alcohol use. She reports that she does not use drugs.  Past Medical History:  Diagnosis Date   Abnormal pap 2007, 2008   colpo x 3   Anemia    Asthma    smoke induced, no inhaler used in  over a year   Genital warts    GERD (gastroesophageal reflux disease)    Gestational diabetes    Hypothyroid    Infection 2017   UTI   Pregnancy induced hypertension    Seasonal allergies    Vaginal Pap smear, abnormal    had colpo- normal since    Past Surgical History:  Procedure Laterality Date   COLPOSCOPY  3/07, 8/07, 1/08   CIN II, benign, CIN I   WISDOM TOOTH EXTRACTION      Current Outpatient Medications  Medication Sig Dispense Refill   fluticasone (FLONASE) 50 MCG/ACT nasal spray Place 2 sprays into both nostrils daily. 16 g 2   fluticasone-salmeterol (ADVAIR DISKUS) 250-50 MCG/ACT AEPB Inhale 1 puff into the lungs in the morning and at bedtime. 60 each 3   levothyroxine (SYNTHROID) 112 MCG tablet Take 1 tablet (112 mcg total) by mouth every morning on an empty stomach. 90 tablet 3   naproxen (NAPROSYN) 500 MG tablet Take 1 tablet (500 mg total) by mouth 2 (two) times daily with a meal. Take every day for one week, then use as needed after that 180 tablet 1    Semaglutide, 2 MG/DOSE, (OZEMPIC, 2 MG/DOSE,) 8 MG/3ML SOPN Inject 2 mg as directed once a week. 9 mL 0   No current facility-administered medications for this visit.    Family History  Problem Relation Age of Onset   Diabetes Father    Hypertension Mother    Diabetes Other        PU x 5, PA x 1   Glaucoma Maternal Grandmother    Hypertension Maternal Grandfather    Hearing loss Neg Hx     ROS: Constitutional: {Findings; ROS constitutional:30497::"negative"} Genitourinary:{Findings; ROS genitourinary:19593::"negative"}  Exam:   BP (!) 137/94 (BP Location: Left Arm, Patient Position: Sitting, Cuff Size: Large)   Pulse 80   Ht 5' 3.25" (1.607 m)   Wt 199 lb 12.8 oz (90.6 kg)   LMP 01/24/2024   BMI 35.11 kg/m   Height: 5' 3.25" (160.7 cm)  General appearance: alert, cooperative and appears stated age Head: Normocephalic, without obvious abnormality, atraumatic Neck: no adenopathy, supple, symmetrical, trachea midline and thyroid {EXAM; THYROID:18604} Lungs: clear to auscultation bilaterally Breasts: {Exam; breast:13139::"normal appearance, no masses or tenderness"} Heart: regular rate and rhythm Abdomen: soft, non-tender; bowel sounds normal; no masses,  no organomegaly Extremities: extremities normal, atraumatic, no cyanosis or edema Skin: Skin color, texture, turgor normal. No rashes or lesions Lymph nodes:  Cervical, supraclavicular, and axillary nodes normal. No abnormal inguinal nodes palpated Neurologic: Grossly normal   Pelvic: External genitalia:  no lesions              Urethra:  normal appearing urethra with no masses, tenderness or lesions              Bartholins and Skenes: normal                 Vagina: normal appearing vagina with normal color and no discharge, no lesions              Cervix: {exam; cervix:14595}              Pap taken: {yes no:314532} Bimanual Exam:  Uterus:  {exam; uterus:12215}              Adnexa: {exam; adnexa:12223}                Rectovaginal: Confirms               Anus:  normal sphincter tone, no lesions  Chaperone, ***, CMA, was present for exam.  Assessment/Plan:

## 2024-02-02 NOTE — Progress Notes (Signed)
38 y.o. G14P2002 Married Other or two or more races female here for annual exam.  Cycles are regular and heavy for 2-3 days with lighter bleeding for 2 days.  Has received 6 iron infusions.  Most recent hb was 12.7 and iron levels were normal.  Has follow up labs scheduled.  Doesn't seem to absorb oral iron very well.  Followed by Eileen Stanford, with hematology/oncology.    We discussed using TXA in the past and rx written.  She hasn't used this and doesn't think she will.  Not interested in IUD either.    Patient's last menstrual period was 01/24/2024.          Sexually active: Yes.    The current method of family planning is condoms always.    Smoker:  no  Health Maintenance: Pap:  01/21/2023 History of abnormal Pap:  remote hx MMG:  guidelines reviewed Colonoscopy:  guidelines reviewed Screening Labs: 05/2023   reports that she has never smoked. She has never used smokeless tobacco. She reports current alcohol use. She reports that she does not use drugs.  Past Medical History:  Diagnosis Date   Abnormal pap 2007, 2008   colpo x 3   Anemia    Asthma    smoke induced, no inhaler used in  over a year   Genital warts    GERD (gastroesophageal reflux disease)    Gestational diabetes    Hypothyroid    Infection 2017   UTI   Pregnancy induced hypertension    Seasonal allergies    Vaginal Pap smear, abnormal    had colpo- normal since    Past Surgical History:  Procedure Laterality Date   COLPOSCOPY  3/07, 8/07, 1/08   CIN II, benign, CIN I   WISDOM TOOTH EXTRACTION      Current Outpatient Medications  Medication Sig Dispense Refill   fluticasone (FLONASE) 50 MCG/ACT nasal spray Place 2 sprays into both nostrils daily. 16 g 2   fluticasone-salmeterol (ADVAIR DISKUS) 250-50 MCG/ACT AEPB Inhale 1 puff into the lungs in the morning and at bedtime. 60 each 3   levothyroxine (SYNTHROID) 112 MCG tablet Take 1 tablet (112 mcg total) by mouth every morning on an empty stomach. 90  tablet 3   naproxen (NAPROSYN) 500 MG tablet Take 1 tablet (500 mg total) by mouth 2 (two) times daily with a meal. Take every day for one week, then use as needed after that 180 tablet 1   Semaglutide, 2 MG/DOSE, (OZEMPIC, 2 MG/DOSE,) 8 MG/3ML SOPN Inject 2 mg as directed once a week. 9 mL 0   No current facility-administered medications for this visit.    Family History  Problem Relation Age of Onset   Diabetes Father    Hypertension Mother    Diabetes Other        PU x 5, PA x 1   Glaucoma Maternal Grandmother    Hypertension Maternal Grandfather    Hearing loss Neg Hx     ROS: Constitutional: negative Genitourinary:negative  Exam:   BP (!) 137/94 (BP Location: Left Arm, Patient Position: Sitting, Cuff Size: Large)   Pulse 80   Ht 5' 3.25" (1.607 m)   Wt 199 lb 12.8 oz (90.6 kg)   LMP 01/24/2024   BMI 35.11 kg/m   Height: 5' 3.25" (160.7 cm)  General appearance: alert, cooperative and appears stated age Head: Normocephalic, without obvious abnormality, atraumatic Neck: no adenopathy, supple, symmetrical, trachea midline and thyroid normal to inspection and palpation  Lungs: clear to auscultation bilaterally Breasts: normal appearance, no masses or tenderness Heart: regular rate and rhythm Abdomen: soft, non-tender; bowel sounds normal; no masses,  no organomegaly Extremities: extremities normal, atraumatic, no cyanosis or edema Skin: Skin color, texture, turgor normal. No rashes or lesions Lymph nodes: Cervical, supraclavicular, and axillary nodes normal. No abnormal inguinal nodes palpated Neurologic: Grossly normal   Pelvic: External genitalia:  no lesions              Urethra:  normal appearing urethra with no masses, tenderness or lesions              Bartholins and Skenes: normal                 Vagina: normal appearing vagina with normal color and no discharge, no lesions              Cervix: no lesions              Pap taken: No. Bimanual Exam:  Uterus:   normal size, contour, position, consistency, mobility, non-tender              Adnexa: normal adnexa and no mass, fullness, tenderness               Rectovaginal: Confirms               Anus:  normal sphincter tone, no lesions  Chaperone declined.  Assessment/Plan: 1. Well woman exam with routine gynecological exam (Primary) - Pap smear neg with neg HR HPV 12/2022 - Mammogram guidelines reviewed - Colonoscopy guidelines reviewed - lab work done with PCP, Tandy Gaw - vaccines reviewed/updated  2. Prediabetes - on ozempic  3. Menorrhagia with regular cycle - improved over the last few months.  Declines TXA, hormonal therapy or progesterone IUD for now.  4. History of iron deficiency anemia - hb and recent iron levels normal.  Followed by Eileen Stanford, hem/onc

## 2024-02-17 ENCOUNTER — Other Ambulatory Visit (HOSPITAL_COMMUNITY): Payer: Self-pay

## 2024-02-17 ENCOUNTER — Other Ambulatory Visit: Payer: Self-pay

## 2024-02-18 ENCOUNTER — Other Ambulatory Visit: Payer: Self-pay

## 2024-03-11 ENCOUNTER — Other Ambulatory Visit: Payer: Self-pay | Admitting: Physician Assistant

## 2024-03-12 ENCOUNTER — Other Ambulatory Visit: Payer: Self-pay

## 2024-03-12 MED ORDER — OZEMPIC (2 MG/DOSE) 8 MG/3ML ~~LOC~~ SOPN
2.0000 mg | PEN_INJECTOR | SUBCUTANEOUS | 0 refills | Status: DC
  Filled 2024-03-12: qty 9, 84d supply, fill #0

## 2024-03-13 ENCOUNTER — Encounter (HOSPITAL_COMMUNITY): Payer: Self-pay

## 2024-03-14 ENCOUNTER — Other Ambulatory Visit (HOSPITAL_COMMUNITY): Payer: Self-pay

## 2024-03-26 LAB — LAB REPORT - SCANNED: A1c: 5.2

## 2024-04-01 ENCOUNTER — Encounter: Payer: Self-pay | Admitting: Family Medicine

## 2024-04-01 ENCOUNTER — Other Ambulatory Visit (HOSPITAL_BASED_OUTPATIENT_CLINIC_OR_DEPARTMENT_OTHER): Payer: Self-pay

## 2024-04-01 ENCOUNTER — Ambulatory Visit: Admitting: Family Medicine

## 2024-04-01 VITALS — BP 138/76 | HR 103 | Ht 63.25 in | Wt 196.0 lb

## 2024-04-01 DIAGNOSIS — F418 Other specified anxiety disorders: Secondary | ICD-10-CM

## 2024-04-01 DIAGNOSIS — R200 Anesthesia of skin: Secondary | ICD-10-CM | POA: Diagnosis not present

## 2024-04-01 DIAGNOSIS — R253 Fasciculation: Secondary | ICD-10-CM

## 2024-04-01 MED ORDER — CYCLOBENZAPRINE HCL 5 MG PO TABS
5.0000 mg | ORAL_TABLET | Freq: Every evening | ORAL | 0 refills | Status: DC | PRN
  Filled 2024-04-01: qty 30, 30d supply, fill #0

## 2024-04-01 MED ORDER — SERTRALINE HCL 25 MG PO TABS
25.0000 mg | ORAL_TABLET | Freq: Every day | ORAL | 1 refills | Status: DC
  Filled 2024-04-01: qty 30, 30d supply, fill #0

## 2024-04-01 NOTE — Patient Instructions (Signed)
 Try to get some good rest tonight.  Make sure you are hydrated tomorrow. Work on some gentle stretches. If you continue to have some numbness then please let us know.  If you start having more pain in the elbow let us know since you are little tender over that lateral epicondyle.

## 2024-04-01 NOTE — Progress Notes (Unsigned)
 Acute Office Visit  Subjective:     Patient ID: Cathy Daniels, female    DOB: 03-28-1986, 38 y.o.   MRN: [id number]  Chief Complaint  Patient presents with   Medical Management of Chronic Issues    HPI Patient is in today for symptoms that started yesterday.  She noticed yesterday that on the left side of her face almost near the corner of her mouth that she was having some twitching of the muscles.  It was not painful like a cramp.  She was not having any dental pain numbness or tingling at that time.  No headache.  No other neurologic symptoms.  Later after looking in the mirror she felt like that side of her face looked a little bit swollen.  Then today the area started to feel numb.  She could feel touching sensation but it still had a numb type feeling even though she was able to feel dull and sharp and cold.  Then this afternoon after she went home she started to feel like she was getting some numbness in her left forearm.  She never experienced any weakness.  No chest pain or chest pressure.  She started to get nervous that she could possibly be having a stroke and started feeling anxious.  She has been taking Claritin for allergies.  She is right-handed.  Note, she is on Ozempic and her last injection was on Tuesday.  He just had labs done through work including a CMP CBC TSH and lipid.  All her labs looked absolutely fantastic.  A1c was 5.2.  ROS      Objective:    BP 138/76   Pulse (!) 103   Ht 5' 3.25" (1.607 m)   Wt 196 lb (88.9 kg)   SpO2 100%   BMI 34.45 kg/m  {Vitals History (Optional):23777}  Physical Exam Vitals and nursing note reviewed.  Constitutional:      Appearance: Normal appearance.  HENT:     Head: Normocephalic and atraumatic.     Right Ear: Tympanic membrane, ear canal and external ear normal. There is no impacted cerumen.     Left Ear: Tympanic membrane, ear canal and external ear normal. There is no impacted cerumen.     Nose: Nose  normal.     Mouth/Throat:     Pharynx: Oropharynx is clear.  Eyes:     Conjunctiva/sclera: Conjunctivae normal.  Neck:     Comments: thyroid is just slightly asymmetric a little larger on the right compared to the left. Cardiovascular:     Rate and Rhythm: Regular rhythm. Tachycardia present.  Pulmonary:     Effort: Pulmonary effort is normal.     Breath sounds: Normal breath sounds.  Musculoskeletal:     Cervical back: Neck supple. No tenderness.     Comments: Neck with normal flexion extension rotation right and left.  Shoulders with normal range of motion.  Strength at the shoulders elbows and wrist is 5 out of 5 bilaterally she is just a little tender over the lateral epicondyle on the left arm.  Lymphadenopathy:     Cervical: No cervical adenopathy.  Skin:    General: Skin is warm and dry.  Neurological:     General: No focal deficit present.     Mental Status: She is alert and oriented to person, place, and time.     Cranial Nerves: No cranial nerve deficit.     Sensory: No sensory deficit.     Motor: No weakness.  Deep Tendon Reflexes: Reflexes normal.  Psychiatric:        Mood and Affect: Mood normal.        Behavior: Behavior normal.     No results found for any visits on 04/01/24.      Assessment & Plan:   Problem List Items Addressed This Visit   None Visit Diagnoses       Left arm numbness    -  Primary   Relevant Orders   EKG 12-Lead     Fasciculations of muscle         Facial numbness          Fasciculations-gave her reassurance that the twitching sounds most consistent with a fasciculation.  This is considered benign and seems to have resolved already and can come from being stressed tension fatigue and sometimes just happen spontaneously.  Gave reassurance.  Left facial numbness she is able to feel cold and touch on that side I do not see any discrete swelling or feel any induration.  She also had recent labs done and everything looked fantastic.   No thyroid disorder no anemia no electrolyte disturbance.  Left forearm numbness-again she is able to feel touch she has normal strength in her extremities reassured her that I do not think she is having a stroke.  EKG today shows rate of 86 bpm, normal sinus rhythm with no acute ST-T wave changes.  Is a little tender of that left lateral epicondyles.  If it becomes more bothersome or painful then we will evaluate further for possible tennis elbow.  Meds ordered this encounter  Medications   sertraline (ZOLOFT) 25 MG tablet    Sig: Take 1 tablet (25 mg total) by mouth at bedtime.    Dispense:  30 tablet    Refill:  1    Please mail   cyclobenzaprine (FLEXERIL) 5 MG tablet    Sig: Take 1 tablet (5 mg total) by mouth at bedtime as needed for muscle spasms.    Dispense:  30 tablet    Refill:  0    Please mail    No follow-ups on file.  Nani Gasser, MD

## 2024-04-02 ENCOUNTER — Other Ambulatory Visit: Payer: Self-pay

## 2024-04-02 ENCOUNTER — Other Ambulatory Visit (HOSPITAL_COMMUNITY): Payer: Self-pay

## 2024-04-02 ENCOUNTER — Encounter (HOSPITAL_COMMUNITY): Payer: Self-pay

## 2024-04-02 ENCOUNTER — Encounter: Payer: Self-pay | Admitting: Physician Assistant

## 2024-04-02 ENCOUNTER — Encounter: Payer: Self-pay | Admitting: Family Medicine

## 2024-04-02 MED ORDER — HYDROXYZINE HCL 10 MG PO TABS
ORAL_TABLET | ORAL | 0 refills | Status: DC
  Filled 2024-04-02: qty 90, 15d supply, fill #0

## 2024-04-02 NOTE — Assessment & Plan Note (Signed)
 We did discuss medications.  I really think she was experiencing a panic attack today.  She and her provider have discussed this previously.  I did go ahead and send a prescription to start with low-dose sertraline and encouraged her to consider it if she does start the medication then I would like for her to follow-up with her PCP in about 4 weeks after starting the medication.

## 2024-05-10 ENCOUNTER — Telehealth: Payer: Self-pay

## 2024-05-10 NOTE — Telephone Encounter (Signed)
 Pharmacy Patient Advocate Encounter   Received notification from CoverMyMeds that prior authorization for Ozempic  (2 MG/DOSE) 8MG /3ML pen-injectors is due for renewal.   Insurance verification completed.   The patient is insured through [facility].    Ozempic Florence Hunt is approved exclusively as an adjunct to diet and exercise to improve glycemic control in adults with type 2 diabetes mellitus. A review of patient's medical chart reveals no documented diagnosis of type 2 diabetes or an A1C indicative of diabetes. Therefore, they do not currently meet the criteria for prior authorization of this medication. If clinically appropriate, alternative options such as Saxenda, Zepbound, or Wegovy  may be considered for this patient.

## 2024-05-12 ENCOUNTER — Other Ambulatory Visit (HOSPITAL_COMMUNITY): Payer: Self-pay

## 2024-05-14 NOTE — Telephone Encounter (Signed)
 Reviewed labs. Pre-diabetes documented.

## 2024-05-14 NOTE — Telephone Encounter (Signed)
 Please review the previous encounters. PA renewal for Ozempic  has been denied by the insurance.

## 2024-05-18 ENCOUNTER — Inpatient Hospital Stay: Payer: Commercial Managed Care - PPO

## 2024-05-18 ENCOUNTER — Ambulatory Visit: Payer: Commercial Managed Care - PPO | Admitting: Family

## 2024-05-20 ENCOUNTER — Other Ambulatory Visit: Payer: Self-pay | Admitting: Physician Assistant

## 2024-05-21 ENCOUNTER — Inpatient Hospital Stay: Payer: Commercial Managed Care - PPO | Attending: Hematology & Oncology

## 2024-05-21 ENCOUNTER — Other Ambulatory Visit: Payer: Self-pay

## 2024-05-21 ENCOUNTER — Inpatient Hospital Stay: Payer: Commercial Managed Care - PPO | Admitting: Family

## 2024-05-21 ENCOUNTER — Encounter: Payer: Self-pay | Admitting: Family

## 2024-05-21 VITALS — BP 132/93 | HR 82 | Temp 98.0°F | Resp 17 | Wt 197.1 lb

## 2024-05-21 DIAGNOSIS — D500 Iron deficiency anemia secondary to blood loss (chronic): Secondary | ICD-10-CM | POA: Diagnosis not present

## 2024-05-21 DIAGNOSIS — D508 Other iron deficiency anemias: Secondary | ICD-10-CM

## 2024-05-21 DIAGNOSIS — N920 Excessive and frequent menstruation with regular cycle: Secondary | ICD-10-CM | POA: Diagnosis not present

## 2024-05-21 LAB — CBC WITH DIFFERENTIAL (CANCER CENTER ONLY)
Abs Immature Granulocytes: 0.01 10*3/uL (ref 0.00–0.07)
Basophils Absolute: 0.1 10*3/uL (ref 0.0–0.1)
Basophils Relative: 1 %
Eosinophils Absolute: 0.2 10*3/uL (ref 0.0–0.5)
Eosinophils Relative: 4 %
HCT: 38.3 % (ref 36.0–46.0)
Hemoglobin: 12.4 g/dL (ref 12.0–15.0)
Immature Granulocytes: 0 %
Lymphocytes Relative: 31 %
Lymphs Abs: 2.1 10*3/uL (ref 0.7–4.0)
MCH: 28.1 pg (ref 26.0–34.0)
MCHC: 32.4 g/dL (ref 30.0–36.0)
MCV: 86.8 fL (ref 80.0–100.0)
Monocytes Absolute: 0.3 10*3/uL (ref 0.1–1.0)
Monocytes Relative: 5 %
Neutro Abs: 4 10*3/uL (ref 1.7–7.7)
Neutrophils Relative %: 59 %
Platelet Count: 323 10*3/uL (ref 150–400)
RBC: 4.41 MIL/uL (ref 3.87–5.11)
RDW: 13.1 % (ref 11.5–15.5)
WBC Count: 6.7 10*3/uL (ref 4.0–10.5)
nRBC: 0 % (ref 0.0–0.2)

## 2024-05-21 LAB — RETICULOCYTES
Immature Retic Fract: 7.2 % (ref 2.3–15.9)
RBC.: 4.35 MIL/uL (ref 3.87–5.11)
Retic Count, Absolute: 57 10*3/uL (ref 19.0–186.0)
Retic Ct Pct: 1.3 % (ref 0.4–3.1)

## 2024-05-21 LAB — IRON AND IRON BINDING CAPACITY (CC-WL,HP ONLY)
Iron: 111 ug/dL (ref 28–170)
Saturation Ratios: 33 % — ABNORMAL HIGH (ref 10.4–31.8)
TIBC: 339 ug/dL (ref 250–450)
UIBC: 228 ug/dL (ref 148–442)

## 2024-05-21 LAB — FERRITIN: Ferritin: 89 ng/mL (ref 11–307)

## 2024-05-21 MED ORDER — LEVOTHYROXINE SODIUM 112 MCG PO TABS
112.0000 ug | ORAL_TABLET | Freq: Every morning | ORAL | 3 refills | Status: DC
Start: 2024-05-21 — End: 2024-05-27
  Filled 2024-05-21: qty 90, 90d supply, fill #0

## 2024-05-21 NOTE — Telephone Encounter (Signed)
 Can you look for the A1C that was in diabetic range that got approved last time for ozempic ? If so we can send to PA department for appeal.

## 2024-05-21 NOTE — Progress Notes (Signed)
 Hematology and Oncology Follow Up Visit  Cathy Daniels [id number] Feb 15, 1986 38 y.o. 05/21/2024   Principle Diagnosis:  Iron  deficiency anemia secondary to heavy cycle   Current Therapy:        IV iron  as indicated    Interim History:  Cathy Daniels is here today for follow-up. She is doing well but did have a virus last week and had 2 episodes of dizziness with near syncope one day after the diarrhea had stopped.  She has not had any more problems since that day.  No fever, chills, n/v, cough, rash, dizziness, SOB, chest pain, palpitations, abdominal pain or changes in bowel or bladder habits.  No abnormal blood loss noted. Her cycle has been regular with lighter flow.  No bruising or petechiae.  No swelling, tenderness, numbness or tingling in her extremities.  No falls or syncope reported.  Appetite and hydration are good. Weight is stable at 197 lbs.   ECOG Performance Status: 1 - Symptomatic but completely ambulatory  Medications:  Allergies as of 05/21/2024       Reactions   Ferrous Sulfate Hives   Repliva OK   Sulfonamide Derivatives Hives   Latex Hives, Itching, Rash   Itching and rash when wearing latex gloves        Medication List        Accurate as of May 21, 2024  8:53 AM. If you have any questions, ask your nurse or doctor.          Advair  Diskus 250-50 MCG/ACT Aepb Generic drug: fluticasone -salmeterol Inhale 1 puff into the lungs in the morning and at bedtime.   cyclobenzaprine  5 MG tablet Commonly known as: FLEXERIL  Take 1 tablet (5 mg total) by mouth at bedtime as needed for muscle spasms.   fluticasone  50 MCG/ACT nasal spray Commonly known as: FLONASE  Place 2 sprays into both nostrils daily.   hydrOXYzine  10 MG tablet Commonly known as: ATARAX  Take 1-2 tablets as needed for anxiety up to three times a day.   levothyroxine  112 MCG tablet Commonly known as: SYNTHROID  Take 1 tablet (112 mcg total) by mouth every morning on an empty stomach.    naproxen  500 MG tablet Commonly known as: NAPROSYN  Take 1 tablet (500 mg total) by mouth 2 (two) times daily with a meal. Take every day for one week, then use as needed after that   Ozempic  (2 MG/DOSE) 8 MG/3ML Sopn Generic drug: Semaglutide  (2 MG/DOSE) Inject 2 mg as directed once a week.   sertraline  25 MG tablet Commonly known as: ZOLOFT  Take 1 tablet (25 mg total) by mouth at bedtime.        Allergies:  Allergies  Allergen Reactions   Ferrous Sulfate Hives    Repliva OK   Sulfonamide Derivatives Hives   Latex Hives, Itching and Rash    Itching and rash when wearing latex gloves    Past Medical History, Surgical history, Social history, and Family History were reviewed and updated.  Review of Systems: All other 10 point review of systems is negative.   Physical Exam:  vitals were not taken for this visit.   Wt Readings from Last 3 Encounters:  04/01/24 196 lb (88.9 kg)  02/02/24 199 lb 12.8 oz (90.6 kg)  01/16/24 196 lb (88.9 kg)    Ocular: Sclerae unicteric, pupils equal, round and reactive to light Ear-nose-throat: Oropharynx clear, dentition fair Lymphatic: No cervical or supraclavicular adenopathy Lungs no rales or rhonchi, good excursion bilaterally Heart regular rate and rhythm, no  murmur appreciated Abd soft, nontender, positive bowel sounds MSK no focal spinal tenderness, no joint edema Neuro: non-focal, well-oriented, appropriate affect Breasts: Deferred   Lab Results  Component Value Date   WBC 6.7 05/21/2024   HGB 12.4 05/21/2024   HCT 38.3 05/21/2024   MCV 86.8 05/21/2024   PLT 323 05/21/2024   Lab Results  Component Value Date   FERRITIN 158 01/16/2024   IRON  56 01/16/2024   TIBC 356 01/16/2024   UIBC 300 01/16/2024   IRONPCTSAT 16 01/16/2024   Lab Results  Component Value Date   RETICCTPCT 1.2 01/16/2024   RBC 4.41 05/21/2024   RETICCTABS 60,580 01/29/2017   No results found for: "KPAFRELGTCHN", "LAMBDASER",  "KAPLAMBRATIO" No results found for: "IGGSERUM", "IGA", "IGMSERUM" No results found for: "TOTALPROTELP", "ALBUMINELP", "A1GS", "A2GS", "BETS", "BETA2SER", "GAMS", "MSPIKE", "SPEI"   Chemistry      Component Value Date/Time   NA 139 06/04/2023 0913   K 4.4 06/04/2023 0913   CL 106 06/04/2023 0913   CO2 22 06/04/2023 0913   BUN 12 06/04/2023 0913   CREATININE 0.71 06/04/2023 0913   CREATININE 0.69 06/03/2022 0000      Component Value Date/Time   CALCIUM 9.0 06/04/2023 0913   ALKPHOS 70 06/04/2023 0913   AST 9 06/04/2023 0913   ALT 7 06/04/2023 0913   BILITOT 0.2 06/04/2023 0913       Impression and Plan: Cathy Daniels is a very pleasant 38 yo female with long history of IDA secondary to heavy cycles. Iron  studies are pending. We will replace if needed.  Follow-up in 3 months.   Kennard Pea, NP 5/30/20258:53 AM

## 2024-05-24 ENCOUNTER — Other Ambulatory Visit (HOSPITAL_COMMUNITY): Payer: Self-pay

## 2024-05-25 ENCOUNTER — Other Ambulatory Visit (HOSPITAL_COMMUNITY): Payer: Self-pay

## 2024-05-25 ENCOUNTER — Encounter (HOSPITAL_COMMUNITY): Payer: Self-pay

## 2024-05-27 ENCOUNTER — Encounter (HOSPITAL_COMMUNITY): Payer: Self-pay | Admitting: Pharmacist

## 2024-05-27 ENCOUNTER — Other Ambulatory Visit: Payer: Self-pay

## 2024-05-27 ENCOUNTER — Other Ambulatory Visit (HOSPITAL_COMMUNITY): Payer: Self-pay

## 2024-05-27 ENCOUNTER — Other Ambulatory Visit: Payer: Self-pay | Admitting: Physician Assistant

## 2024-05-27 NOTE — Telephone Encounter (Signed)
 Mylan brand is NOT available. The patient has agreed to try Accord.  The [place] brand is the one she has not tolerated in the past. Can you authorized the change to accord?  Or send a new prescription allowing the substitution?

## 2024-05-28 ENCOUNTER — Encounter: Payer: Self-pay | Admitting: Pharmacy Technician

## 2024-05-28 ENCOUNTER — Encounter (HOSPITAL_COMMUNITY): Payer: Self-pay | Admitting: Pharmacist

## 2024-05-28 ENCOUNTER — Other Ambulatory Visit (HOSPITAL_COMMUNITY): Payer: Self-pay

## 2024-05-28 ENCOUNTER — Other Ambulatory Visit: Payer: Self-pay

## 2024-05-28 ENCOUNTER — Other Ambulatory Visit (HOSPITAL_BASED_OUTPATIENT_CLINIC_OR_DEPARTMENT_OTHER): Payer: Self-pay

## 2024-05-28 MED ORDER — LEVOTHYROXINE SODIUM 112 MCG PO TABS
112.0000 ug | ORAL_TABLET | Freq: Every morning | ORAL | 3 refills | Status: AC
  Filled 2024-05-28: qty 90, 90d supply, fill #0
  Filled 2024-08-22: qty 90, 90d supply, fill #1
  Filled 2024-11-19: qty 90, 90d supply, fill #2

## 2024-05-28 NOTE — Telephone Encounter (Signed)
 Please send an updated rx to the pharmacy. Thanks in advance.

## 2024-05-28 NOTE — Telephone Encounter (Signed)
 Ok to change to accord. Is new rx needed for that?

## 2024-05-28 NOTE — Telephone Encounter (Signed)
 ERROR

## 2024-05-31 ENCOUNTER — Other Ambulatory Visit (HOSPITAL_COMMUNITY): Payer: Self-pay

## 2024-06-01 ENCOUNTER — Other Ambulatory Visit (HOSPITAL_COMMUNITY): Payer: Self-pay

## 2024-06-02 ENCOUNTER — Ambulatory Visit (INDEPENDENT_AMBULATORY_CARE_PROVIDER_SITE_OTHER): Admitting: Physician Assistant

## 2024-06-02 ENCOUNTER — Encounter: Payer: Self-pay | Admitting: Physician Assistant

## 2024-06-02 ENCOUNTER — Encounter: Payer: Self-pay | Admitting: Endocrinology

## 2024-06-02 ENCOUNTER — Telehealth: Payer: Self-pay

## 2024-06-02 ENCOUNTER — Other Ambulatory Visit: Payer: Self-pay

## 2024-06-02 VITALS — BP 130/80 | HR 80 | Wt 194.0 lb

## 2024-06-02 DIAGNOSIS — Z0000 Encounter for general adult medical examination without abnormal findings: Secondary | ICD-10-CM

## 2024-06-02 DIAGNOSIS — D508 Other iron deficiency anemias: Secondary | ICD-10-CM

## 2024-06-02 DIAGNOSIS — Z7985 Long-term (current) use of injectable non-insulin antidiabetic drugs: Secondary | ICD-10-CM | POA: Diagnosis not present

## 2024-06-02 DIAGNOSIS — F418 Other specified anxiety disorders: Secondary | ICD-10-CM

## 2024-06-02 DIAGNOSIS — E1169 Type 2 diabetes mellitus with other specified complication: Secondary | ICD-10-CM | POA: Diagnosis not present

## 2024-06-02 DIAGNOSIS — Z79899 Other long term (current) drug therapy: Secondary | ICD-10-CM

## 2024-06-02 DIAGNOSIS — E559 Vitamin D deficiency, unspecified: Secondary | ICD-10-CM | POA: Diagnosis not present

## 2024-06-02 DIAGNOSIS — E538 Deficiency of other specified B group vitamins: Secondary | ICD-10-CM | POA: Diagnosis not present

## 2024-06-02 DIAGNOSIS — E119 Type 2 diabetes mellitus without complications: Secondary | ICD-10-CM | POA: Insufficient documentation

## 2024-06-02 DIAGNOSIS — E039 Hypothyroidism, unspecified: Secondary | ICD-10-CM | POA: Diagnosis not present

## 2024-06-02 MED ORDER — OZEMPIC (2 MG/DOSE) 8 MG/3ML ~~LOC~~ SOPN
2.0000 mg | PEN_INJECTOR | SUBCUTANEOUS | 1 refills | Status: DC
  Filled 2024-06-02: qty 9, 84d supply, fill #0

## 2024-06-02 NOTE — Patient Instructions (Signed)

## 2024-06-02 NOTE — Telephone Encounter (Addendum)
 Initiated Prior Appeal for:Ozempic  (2 MG/DOSE) 8MG /3ML pen-injectors Via: Covermymeds Case/Key:[id number] Status: approved  as of 06/02/24 Reason:The authorization is effective from 05/27/2024 to 05/26/2025 Notified Pt via: Mychart  Appeal  submitted  with chart notes and previous  notes from previous provider of elevated A1c  for 6.5

## 2024-06-02 NOTE — Progress Notes (Signed)
 Complete physical exam  Patient: Cathy Daniels   DOB: 03-30-1986   37 y.o. Female  MRN: [id number]  Subjective:     Chief Complaint  Patient presents with   Annual Exam    Cathy Daniels is a 38 y.o. female who presents today for a complete physical exam. She reports consuming a general diet. Pt does walk 30 minutes daily.  She generally feels well. She reports sleeping well. She does not have additional problems to discuss today.   She is concerned her ozempic  was declined due to not having A1C in chart that showed DM. Pt brings in confirmation of A1C of 6.6 to confirm Dx of Diabetes. She is tolerating ozempic  well. She has lost weight and able to maintain. Last A1C was 5.2.    Most recent fall risk assessment:    06/02/2024    8:13 AM  Fall Risk   Falls in the past year? 0  Number falls in past yr: 0  Injury with Fall? 0  Risk for fall due to : No Fall Risks  Follow up Falls evaluation completed     Most recent depression screenings:    06/02/2024    8:14 AM 02/02/2024    4:44 PM  PHQ 2/9 Scores  PHQ - 2 Score 0 0    Vision:Within last year and Dental: No current dental problems and Receives regular dental care  Patient Active Problem List   Diagnosis Date Noted   Diabetes mellitus type II, controlled (HCC) 06/02/2024   Acute pain of left knee 10/21/2023   Allergic reaction 10/21/2023   Systolic murmur 06/18/2023   Ear fullness, bilateral 04/11/2023   Tinnitus of both ears 04/11/2023   Seasonal allergies 04/11/2023   ETD (Eustachian tube dysfunction), bilateral 04/11/2023   Adjustment disorder with anxiety 09/19/2022   Stress 09/11/2022   Low serum vitamin B12 06/04/2022   Pre-diabetes 06/03/2022   Iron  deficiency anemia secondary to inadequate dietary iron  intake 06/03/2022   Elevated blood pressure reading 06/03/2022   Newly recognized heart murmur 06/03/2022   Class 1 obesity due to excess calories without serious comorbidity with body mass index (BMI) of  34.0 to 34.9 in adult 06/03/2022   NSVD (normal spontaneous vaginal delivery) 12/23/2018   Diabetes mellitus affecting pregnancy in third trimester 12/22/2018   Vitamin D  deficiency 02/03/2017   Hypothyroidism 01/28/2017   History of prediabetes 01/28/2017   History of gestational diabetes 01/28/2017   Eczema of both hands 01/28/2017   Situational anxiety 01/28/2017   History of asthma 01/28/2017   Menstrual cramp 01/28/2017   Vaginal delivery 12/17/2013   Allergic rhinitis 03/30/2010   Asthma, moderate persistent 04/19/2008   Past Medical History:  Diagnosis Date   Abnormal pap 2007, 2008   colpo x 3   Anemia    Asthma    smoke induced, no inhaler used in  over a year   Genital warts    GERD (gastroesophageal reflux disease)    Gestational diabetes    Hypothyroid    Infection 2017   UTI   Pregnancy induced hypertension    Seasonal allergies    Past Surgical History:  Procedure Laterality Date   COLPOSCOPY  3/07, 8/07, 1/08   CIN II, benign, CIN I   WISDOM TOOTH EXTRACTION     Family History  Problem Relation Age of Onset   Diabetes Father    Hypertension Mother    Diabetes Other        PU x 5, PA x  1   Glaucoma Maternal Grandmother    Hypertension Maternal Grandfather    Hearing loss Neg Hx    Allergies  Allergen Reactions   Ferrous Sulfate Hives    Repliva OK   Sulfonamide Derivatives Hives   Latex Hives, Itching and Rash    Itching and rash when wearing latex gloves      Patient Care Team: Akshita Italiano L, [name] as PCP - General (Family Medicine) Tasia Farr, MD as Attending Physician (Endocrinology)   Outpatient Medications Prior to Visit  Medication Sig   ASHWAGANDHA PO Take 1 tablet by mouth at bedtime.   cyclobenzaprine  (FLEXERIL ) 5 MG tablet Take 1 tablet (5 mg total) by mouth at bedtime as needed for muscle spasms.   fluticasone  (FLONASE ) 50 MCG/ACT nasal spray Place 2 sprays into both nostrils daily.   fluticasone -salmeterol (ADVAIR   DISKUS) 250-50 MCG/ACT AEPB Inhale 1 puff into the lungs in the morning and at bedtime.   levothyroxine  (SYNTHROID ) 112 MCG tablet Take 1 tablet (112 mcg total) by mouth every morning on an empty stomach.   naproxen  (NAPROSYN ) 500 MG tablet Take 1 tablet (500 mg total) by mouth 2 (two) times daily with a meal. Take every day for one week, then use as needed after that   [DISCONTINUED] Semaglutide , 2 MG/DOSE, (OZEMPIC , 2 MG/DOSE,) 8 MG/3ML SOPN Inject 2 mg as directed once a week.   hydrOXYzine  (ATARAX ) 10 MG tablet Take 1-2 tablets as needed for anxiety up to three times a day. (Patient not taking: Reported on 06/02/2024)   [DISCONTINUED] sertraline  (ZOLOFT ) 25 MG tablet Take 1 tablet (25 mg total) by mouth at bedtime. (Patient not taking: Reported on 06/02/2024)   No facility-administered medications prior to visit.    Review of Systems  All other systems reviewed and are negative.         Objective:     BP 130/80 (Cuff Size: Normal)   Pulse 80   Wt 194 lb (88 kg)   SpO2 100%   BMI 34.09 kg/m  BP Readings from Last 3 Encounters:  06/02/24 130/80  05/21/24 (!) 132/93  04/01/24 138/76   Wt Readings from Last 3 Encounters:  06/02/24 194 lb (88 kg)  05/21/24 197 lb 1.9 oz (89.4 kg)  04/01/24 196 lb (88.9 kg)      Physical Exam  BP 130/80 (Cuff Size: Normal)   Pulse 80   Wt 194 lb (88 kg)   SpO2 100%   BMI 34.09 kg/m   General Appearance:    Alert, cooperative, obese no distress, appears stated age  Head:    Normocephalic, without obvious abnormality, atraumatic  Eyes:    PERRL, conjunctiva/corneas clear, EOM's intact, fundi    benign, both eyes  Ears:    Normal TM's and external ear canals, both ears  Nose:   Nares normal, septum midline, mucosa normal, no drainage    or sinus tenderness  Throat:   Lips, mucosa, and tongue normal; teeth and gums normal  Neck:   Supple, symmetrical, trachea midline, no adenopathy;    thyroid :  no enlargement/tenderness/nodules; no  carotid   bruit or JVD  Back:     Symmetric, no curvature, ROM normal, no CVA tenderness  Lungs:     Clear to auscultation bilaterally, respirations unlabored  Chest Wall:    No tenderness or deformity   Heart:    Regular rate and rhythm, S1 and S2 normal, no murmur, rub   or gallop     Abdomen:  Soft, non-tender, bowel sounds active all four quadrants,    no masses, no organomegaly        Extremities:   Extremities normal, atraumatic, no cyanosis or edema  Pulses:   2+ and symmetric all extremities  Skin:   Skin color, texture, turgor normal, no rashes or lesions  Lymph nodes:   Cervical, supraclavicular, and axillary nodes normal  Neurologic:   CNII-XII intact, normal strength, sensation and reflexes    throughout      Assessment & Plan:    Routine Health Maintenance and Physical Exam  Immunization History  Administered Date(s) Administered   DTaP 08/23/1986, 09/23/1986, 10/23/1986, 01/07/1988, 08/06/1988   Fluzone Influenza virus vaccine,trivalent (IIV3), split virus 11/15/2009, 08/28/2018   HPV Quadrivalent 07/01/2006, 08/29/2006, 01/16/2007   Hepatitis B, PED/ADOLESCENT 05/24/1997, 06/24/1997, 11/24/1997   IPV 08-23-1986, 08/23/1986, 09/23/1986, 10/23/1986, 01/07/1988   Influenza Split 11/23/2010   Influenza Whole 11/22/2009, 09/23/2011   Influenza,inj,Quad PF,6+ Mos 09/18/2022   Influenza,inj,quad, With Preservative 08/28/2018   Influenza-Unspecified 08/23/2016, 09/22/2016, 08/24/2023   MMR 04/26/1987, 10/24/1987, 11/21/1997   PFIZER(Purple Top)SARS-COV-2 Vaccination 01/11/2020, 02/01/2020, 12/14/2020   Td 11/21/1997, 11/15/2009   Td (Adult) 11/21/1997, 11/15/2009   Tdap 11/15/2009, 10/01/2018   Varicella 11/22/2009, 01/12/2010    Health Maintenance  Topic Date Due   FOOT EXAM  Never done   Diabetic kidney evaluation - Urine ACR  Never done   Diabetic kidney evaluation - eGFR measurement  06/03/2024   COVID-19 Vaccine (4 - 2024-25 season) 06/18/2024  (Originally 08/24/2023)   Pneumococcal Vaccine 66-56 Years old (1 of 2 - PCV) 06/02/2025 (Originally 07/14/2005)   INFLUENZA VACCINE  07/23/2024   HEMOGLOBIN A1C  09/25/2024   OPHTHALMOLOGY EXAM  12/21/2024   Cervical Cancer Screening (HPV/Pap Cotest)  01/22/2028   DTaP/Tdap/Td (11 - Td or Tdap) 10/01/2028   HPV VACCINES  Completed   Hepatitis C Screening  Completed   HIV Screening  Completed   Meningococcal B Vaccine  Aged Out    Discussed health benefits of physical activity, and encouraged her to engage in regular exercise appropriate for her age and condition.  Aaron AasDonnel Gail was seen today for annual exam.  Diagnoses and all orders for this visit:  Routine physical examination  Acquired hypothyroidism -     TSH + free T4  Low serum vitamin B12 -     B12 and Folate Panel  Vitamin D  deficiency -     VITAMIN D  25 Hydroxy (Vit-D Deficiency, Fractures)  Medication management -     CBC with Differential/Platelet -     CMP14+EGFR -     VITAMIN D  25 Hydroxy (Vit-D Deficiency, Fractures) -     TSH + free T4 -     Fe+TIBC+Fer -     B12 and Folate Panel  Iron  deficiency anemia secondary to inadequate dietary iron  intake -     CBC with Differential/Platelet -     Fe+TIBC+Fer  Situational anxiety  Controlled type 2 diabetes mellitus with other specified complication, without long-term current use of insulin  (HCC)  Other orders -     Semaglutide , 2 MG/DOSE, (OZEMPIC , 2 MG/DOSE,) 8 MG/3ML SOPN; Inject 2 mg as directed once a week.   .. Discussed 150 minutes of exercise a week.  Encouraged vitamin D  1000 units and Calcium 1300mg  or 4 servings of dairy a day.  PHQ no concerns Keep hydroxyzine  if having any situational anxiety Fasting labs ordered today Iron  panel for IDA follow up.  Last A1C to goal at 5.2, hx of  DM with A1C of 6.6 in 2014 that she has been controlled with medication as well as diet and lifestyle changes, will make appeal for ozempic  as it is medically necessary.   Levothyroxine  manufactor change recheck TSH in 4-6 weeks Vaccines UTD Pap UTD Follow up in 6 months    Return in about 6 months (around 12/02/2024).     Mayra Brahm, [name]

## 2024-06-04 NOTE — Telephone Encounter (Signed)
 Awesome! Thank you. Let patient know.

## 2024-06-11 ENCOUNTER — Other Ambulatory Visit: Payer: Self-pay

## 2024-06-11 ENCOUNTER — Other Ambulatory Visit (HOSPITAL_COMMUNITY): Payer: Self-pay

## 2024-06-11 MED ORDER — ATOVAQUONE-PROGUANIL HCL 250-100 MG PO TABS
1.0000 | ORAL_TABLET | Freq: Every day | ORAL | 0 refills | Status: DC
  Filled 2024-06-11: qty 15, 15d supply, fill #0

## 2024-06-11 MED ORDER — VIVOTIF PO CPDR
1.0000 | DELAYED_RELEASE_CAPSULE | ORAL | 0 refills | Status: DC
  Filled 2024-06-11: qty 4, 8d supply, fill #0

## 2024-06-11 MED ORDER — AZITHROMYCIN 500 MG PO TABS
ORAL_TABLET | ORAL | 0 refills | Status: DC
  Filled 2024-06-11: qty 2, 1d supply, fill #0

## 2024-06-18 ENCOUNTER — Encounter: Payer: Commercial Managed Care - PPO | Admitting: Physician Assistant

## 2024-06-22 ENCOUNTER — Telehealth: Payer: Self-pay | Admitting: Family

## 2024-06-22 DIAGNOSIS — J0110 Acute frontal sinusitis, unspecified: Secondary | ICD-10-CM

## 2024-06-22 MED ORDER — DOXYCYCLINE HYCLATE 100 MG PO TABS: 100.0000 mg | ORAL_TABLET | Freq: Two times a day (BID) | ORAL | 0 refills | Status: AC

## 2024-06-22 NOTE — Telephone Encounter (Signed)
 Pt called in with c/o sinus infection has tried augmentin  in the past without success, does well on doxycycline . Denies chance of pregnancy.   With sx of nasal congestion facial pain runny nose ear fullness pnd and sinus pressure around the cheek bones. Sx x one week.

## 2024-07-02 ENCOUNTER — Encounter: Admitting: Physician Assistant

## 2024-07-23 ENCOUNTER — Other Ambulatory Visit (HOSPITAL_COMMUNITY): Payer: Self-pay

## 2024-07-23 ENCOUNTER — Encounter: Payer: Self-pay | Admitting: Physician Assistant

## 2024-07-23 ENCOUNTER — Other Ambulatory Visit (HOSPITAL_BASED_OUTPATIENT_CLINIC_OR_DEPARTMENT_OTHER): Payer: Self-pay

## 2024-07-23 MED ORDER — OZEMPIC (1 MG/DOSE) 4 MG/3ML ~~LOC~~ SOPN
1.0000 mg | PEN_INJECTOR | SUBCUTANEOUS | 0 refills | Status: DC
  Filled 2024-07-23: qty 3, 28d supply, fill #0

## 2024-07-23 NOTE — Addendum Note (Signed)
 Addended by: Hamish Banks D on: 07/23/2024 03:27 PM   Modules accepted: Orders

## 2024-07-28 ENCOUNTER — Encounter: Payer: Self-pay | Admitting: Physician Assistant

## 2024-08-20 ENCOUNTER — Inpatient Hospital Stay: Admitting: Family

## 2024-08-20 ENCOUNTER — Inpatient Hospital Stay

## 2024-08-22 ENCOUNTER — Other Ambulatory Visit (HOSPITAL_COMMUNITY): Payer: Self-pay

## 2024-09-27 ENCOUNTER — Other Ambulatory Visit: Payer: Self-pay | Admitting: Family

## 2024-09-27 DIAGNOSIS — L03119 Cellulitis of unspecified part of limb: Secondary | ICD-10-CM

## 2024-09-27 MED ORDER — DOXYCYCLINE HYCLATE 100 MG PO TABS: 100.0000 mg | ORAL_TABLET | Freq: Two times a day (BID) | ORAL | 0 refills | Status: DC

## 2024-09-27 NOTE — Progress Notes (Signed)
 Here for c/o cellulitis with infection and yellow discharge from top of left upper scalp.

## 2024-10-01 ENCOUNTER — Other Ambulatory Visit

## 2024-10-01 ENCOUNTER — Ambulatory Visit: Admitting: Family

## 2024-10-06 ENCOUNTER — Telehealth: Admitting: Physician Assistant

## 2024-10-06 ENCOUNTER — Other Ambulatory Visit

## 2024-10-06 DIAGNOSIS — E559 Vitamin D deficiency, unspecified: Secondary | ICD-10-CM

## 2024-10-06 DIAGNOSIS — R5383 Other fatigue: Secondary | ICD-10-CM

## 2024-10-06 DIAGNOSIS — E119 Type 2 diabetes mellitus without complications: Secondary | ICD-10-CM | POA: Diagnosis not present

## 2024-10-06 DIAGNOSIS — D508 Other iron deficiency anemias: Secondary | ICD-10-CM | POA: Diagnosis not present

## 2024-10-06 DIAGNOSIS — E039 Hypothyroidism, unspecified: Secondary | ICD-10-CM

## 2024-10-06 DIAGNOSIS — E538 Deficiency of other specified B group vitamins: Secondary | ICD-10-CM | POA: Diagnosis not present

## 2024-10-06 DIAGNOSIS — Z7985 Long-term (current) use of injectable non-insulin antidiabetic drugs: Secondary | ICD-10-CM

## 2024-10-06 DIAGNOSIS — R635 Abnormal weight gain: Secondary | ICD-10-CM | POA: Diagnosis not present

## 2024-10-06 NOTE — Progress Notes (Signed)
..  Virtual Visit via Video Note  I connected with Cathy Daniels on 10/06/24 at  1:00 PM EDT by a video enabled telemedicine application and verified that I am speaking with the correct person using two identifiers.  Location: Patient: in car Provider: in clinic  .SABRAParticipating in visit:  Patient: Cathy Daniels Provider: Vermell Bologna [name]   I discussed the limitations of evaluation and management by telemedicine and the availability of in person appointments. The patient expressed understanding and agreed to proceed.  History of Present Illness: Pt is a 38 yo female who calls into the clinic with concerns with increase in fatigue and weight gain. She does have hx of T2DM, hypothyroidism, IDA, Vitamin D  deficiency. She noticed all of this since this summer after 1 month in [place] to see family. She stopped her ozempic  during this time because too hard to travel with it.  She continued on thyroid  medication and restarted ozempic  when she returned from travel. She has continued to gain weight. She is up 6lbs since August. She feels hungier and more tired. She is trying to stay active. She would like labs and consider changing from ozempic  to mounjaro. She has been keeping her with iron  infusions.     Observations/Objective: No acute distress Normal mood and appearance Normal breathing   Assessment and Plan:  SABRASABRADiagnoses and all orders for this visit:  Abnormal weight gain -     CBC w/Diff/Platelet -     Comprehensive metabolic panel with GFR -     TSH -     Hemoglobin A1c -     B12 and Folate Panel -     T4, free -     IBC panel  Acquired hypothyroidism -     TSH -     T4, free  Vitamin D  deficiency  Iron  deficiency anemia secondary to inadequate dietary iron  intake -     CBC w/Diff/Platelet -     IBC panel  Low serum vitamin B12 -     B12 and Folate Panel  Diabetes mellitus type II, controlled (HCC) -     Hemoglobin A1c  No energy -     CBC w/Diff/Platelet -      Comprehensive metabolic panel with GFR -     TSH -     Hemoglobin A1c -     B12 and Folate Panel -     T4, free -     IBC panel   Labs ordered to look for any reason for fatigue/weight gain and to adjust medications.  Will hold on making the ozempic  to [facility] switch for now.    Follow Up Instructions:    I discussed the assessment and treatment plan with the patient. The patient was provided an opportunity to ask questions and all were answered. The patient agreed with the plan and demonstrated an understanding of the instructions.   The patient was advised to call back or seek an in-person evaluation if the symptoms worsen or if the condition fails to improve as anticipated.  Lavon Horn, [name]

## 2024-10-07 LAB — CBC WITH DIFFERENTIAL/PLATELET
Basophils Absolute: 0.1 x10E3/uL (ref 0.0–0.2)
Basos: 1 %
EOS (ABSOLUTE): 0.4 x10E3/uL (ref 0.0–0.4)
Eos: 4 %
Hematocrit: 37.7 % (ref 34.0–46.6)
Hemoglobin: 12.3 g/dL (ref 11.1–15.9)
Immature Grans (Abs): 0 x10E3/uL (ref 0.0–0.1)
Immature Granulocytes: 0 %
Lymphocytes Absolute: 2.9 x10E3/uL (ref 0.7–3.1)
Lymphs: 35 %
MCH: 28.6 pg (ref 26.6–33.0)
MCHC: 32.6 g/dL (ref 31.5–35.7)
MCV: 88 fL (ref 79–97)
Monocytes Absolute: 0.4 x10E3/uL (ref 0.1–0.9)
Monocytes: 5 %
Neutrophils Absolute: 4.6 x10E3/uL (ref 1.4–7.0)
Neutrophils: 55 %
Platelets: 364 x10E3/uL (ref 150–450)
RBC: 4.3 x10E6/uL (ref 3.77–5.28)
RDW: 12.5 % (ref 11.7–15.4)
WBC: 8.4 x10E3/uL (ref 3.4–10.8)

## 2024-10-07 LAB — COMPREHENSIVE METABOLIC PANEL WITH GFR
ALT: 8 IU/L (ref 0–32)
AST: 13 IU/L (ref 0–40)
Albumin: 4.5 g/dL (ref 3.9–4.9)
Alkaline Phosphatase: 70 IU/L (ref 41–116)
BUN/Creatinine Ratio: 16 (ref 9–23)
BUN: 11 mg/dL (ref 6–20)
Bilirubin Total: 0.3 mg/dL (ref 0.0–1.2)
CO2: 21 mmol/L (ref 20–29)
Calcium: 9.3 mg/dL (ref 8.7–10.2)
Chloride: 104 mmol/L (ref 96–106)
Creatinine, Ser: 0.7 mg/dL (ref 0.57–1.00)
Globulin, Total: 2.3 g/dL (ref 1.5–4.5)
Glucose: 103 mg/dL — ABNORMAL HIGH (ref 70–99)
Potassium: 4.2 mmol/L (ref 3.5–5.2)
Sodium: 141 mmol/L (ref 134–144)
Total Protein: 6.8 g/dL (ref 6.0–8.5)
eGFR: 113 mL/min/1.73 (ref >59)

## 2024-10-07 LAB — IRON AND TIBC
Iron Saturation: 12 % — ABNORMAL LOW (ref 15–55)
Iron: 38 ug/dL (ref 27–159)
Total Iron Binding Capacity: 307 ug/dL (ref 250–450)
UIBC: 269 ug/dL (ref 131–425)

## 2024-10-07 LAB — HEMOGLOBIN A1C
Est. average glucose Bld gHb Est-mCnc: 105 mg/dL
Hgb A1c MFr Bld: 5.3 % (ref 4.8–5.6)

## 2024-10-07 LAB — B12 AND FOLATE PANEL
Folate: 4.5 ng/mL (ref >3.0)
Vitamin B-12: 177 pg/mL — ABNORMAL LOW (ref 232–1245)

## 2024-10-07 LAB — TSH: TSH: 4.07 u[IU]/mL (ref 0.450–4.500)

## 2024-10-07 LAB — T4, FREE: Free T4: 1.41 ng/dL (ref 0.82–1.77)

## 2024-10-08 ENCOUNTER — Encounter: Payer: Self-pay | Admitting: Family

## 2024-10-08 ENCOUNTER — Encounter (HOSPITAL_COMMUNITY): Payer: Self-pay

## 2024-10-08 ENCOUNTER — Ambulatory Visit: Payer: Self-pay | Admitting: Physician Assistant

## 2024-10-08 ENCOUNTER — Encounter: Payer: Self-pay | Admitting: Physician Assistant

## 2024-10-08 ENCOUNTER — Other Ambulatory Visit (HOSPITAL_COMMUNITY): Payer: Self-pay

## 2024-10-08 DIAGNOSIS — E538 Deficiency of other specified B group vitamins: Secondary | ICD-10-CM

## 2024-10-08 MED ORDER — VITAMIN D (ERGOCALCIFEROL) 1.25 MG (50000 UNIT) PO CAPS
50000.0000 [IU] | ORAL_CAPSULE | ORAL | 1 refills | Status: AC
  Filled 2024-10-08: qty 12, 84d supply, fill #0
  Filled 2025-01-13: qty 12, 84d supply, fill #1

## 2024-10-08 MED ORDER — TIRZEPATIDE 7.5 MG/0.5ML ~~LOC~~ SOAJ
7.5000 mg | SUBCUTANEOUS | 0 refills | Status: DC
  Filled 2024-10-08: qty 2, 28d supply, fill #0
  Filled 2024-11-19: qty 2, 28d supply, fill #1

## 2024-10-08 NOTE — Progress Notes (Signed)
 A1C looks great.  Free T4 wonderful, your tSH trending towards more HYPO thyroid . We should watch thyroid  and recheck in 3 months.  Kidney, liver, glucose look good.  B12 is low. Do you want to try OTC or start getting monthly shots? Recheck in 3 months.  Your iron  is low and iron  saturation is low. You could benefit from iron  infusion.

## 2024-10-08 NOTE — Progress Notes (Signed)
 Vitamin D  is worse. Are you taking any at all? Make sure to take K2 with it and with dairy for better absorption.   I sent mounjaro 7.5mg  weekly to replace ozempic .

## 2024-10-11 ENCOUNTER — Other Ambulatory Visit: Payer: Self-pay

## 2024-10-27 ENCOUNTER — Encounter (HOSPITAL_COMMUNITY): Payer: Self-pay

## 2024-10-28 ENCOUNTER — Inpatient Hospital Stay: Attending: Hematology & Oncology

## 2024-10-28 VITALS — BP 151/96 | HR 78 | Temp 98.2°F | Resp 18

## 2024-10-28 DIAGNOSIS — D500 Iron deficiency anemia secondary to blood loss (chronic): Secondary | ICD-10-CM | POA: Insufficient documentation

## 2024-10-28 DIAGNOSIS — N920 Excessive and frequent menstruation with regular cycle: Secondary | ICD-10-CM | POA: Insufficient documentation

## 2024-10-28 DIAGNOSIS — D508 Other iron deficiency anemias: Secondary | ICD-10-CM

## 2024-10-28 MED ORDER — IRON SUCROSE 300 MG IVPB - SIMPLE MED
300.0000 mg | Freq: Once | Status: AC
  Administered 2024-10-28: 300 mg via INTRAVENOUS
  Filled 2024-10-28: qty 300

## 2024-10-28 MED ORDER — SODIUM CHLORIDE 0.9 % IV SOLN: Freq: Once | INTRAVENOUS | Status: AC

## 2024-10-28 NOTE — Patient Instructions (Signed)

## 2024-10-28 NOTE — Progress Notes (Signed)
 Pt. will f/u with PCP re: elevated B/P. Pt denies any blurred vision or headache.

## 2024-10-29 LAB — SPECIMEN STATUS REPORT

## 2024-10-29 LAB — VITAMIN D 25 HYDROXY (VIT D DEFICIENCY, FRACTURES): Vit D, 25-Hydroxy: 13.9 ng/mL — ABNORMAL LOW (ref 30.0–100.0)

## 2024-10-30 ENCOUNTER — Other Ambulatory Visit (HOSPITAL_COMMUNITY): Payer: Self-pay

## 2024-11-04 ENCOUNTER — Inpatient Hospital Stay

## 2024-11-04 VITALS — BP 121/84 | HR 83 | Temp 98.3°F | Resp 18

## 2024-11-04 DIAGNOSIS — D508 Other iron deficiency anemias: Secondary | ICD-10-CM

## 2024-11-04 DIAGNOSIS — N920 Excessive and frequent menstruation with regular cycle: Secondary | ICD-10-CM | POA: Diagnosis not present

## 2024-11-04 DIAGNOSIS — D500 Iron deficiency anemia secondary to blood loss (chronic): Secondary | ICD-10-CM | POA: Diagnosis not present

## 2024-11-04 MED ORDER — SODIUM CHLORIDE 0.9 % IV SOLN: Freq: Once | INTRAVENOUS | Status: AC

## 2024-11-04 MED ORDER — IRON SUCROSE 300 MG IVPB - SIMPLE MED
300.0000 mg | Freq: Once | Status: AC
  Administered 2024-11-04: 300 mg via INTRAVENOUS
  Filled 2024-11-04: qty 300

## 2024-11-04 NOTE — Patient Instructions (Signed)
 Iron Sucrose Injection What is this medication? IRON SUCROSE (EYE ern SOO krose) treats low levels of iron (iron deficiency anemia) in people with kidney disease. Iron is a mineral that plays an important role in making red blood cells, which carry oxygen from your lungs to the rest of your body. This medicine may be used for other purposes; ask your health care provider or pharmacist if you have questions. COMMON BRAND NAME(S): Venofer What should I tell my care team before I take this medication? They need to know if you have any of these conditions: Anemia not caused by low iron levels Heart disease High levels of iron in the blood Kidney disease Liver disease An unusual or allergic reaction to iron, other medications, foods, dyes, or preservatives Pregnant or trying to get pregnant Breastfeeding How should I use this medication? This medication is for infusion into a vein. It is given in a hospital or clinic setting. Talk to your care team about the use of this medication in children. While this medication may be prescribed for children as young as 2 years for selected conditions, precautions do apply. Overdosage: If you think you have taken too much of this medicine contact a poison control center or emergency room at once. NOTE: This medicine is only for you. Do not share this medicine with others. What if I miss a dose? Keep appointments for follow-up doses. It is important not to miss your dose. Call your care team if you are unable to keep an appointment. What may interact with this medication? Do not take this medication with any of the following: Deferoxamine Dimercaprol Other iron products This medication may also interact with the following: Chloramphenicol Deferasirox This list may not describe all possible interactions. Give your health care provider a list of all the medicines, herbs, non-prescription drugs, or dietary supplements you use. Also tell them if you smoke,  drink alcohol, or use illegal drugs. Some items may interact with your medicine. What should I watch for while using this medication? Visit your care team regularly. Tell your care team if your symptoms do not start to get better or if they get worse. You may need blood work done while you are taking this medication. You may need to follow a special diet. Talk to your care team. Foods that contain iron include: whole grains/cereals, dried fruits, beans, or peas, leafy green vegetables, and organ meats (liver, kidney). What side effects may I notice from receiving this medication? Side effects that you should report to your care team as soon as possible: Allergic reactions--skin rash, itching, hives, swelling of the face, lips, tongue, or throat Low blood pressure--dizziness, feeling faint or lightheaded, blurry vision Shortness of breath Side effects that usually do not require medical attention (report to your care team if they continue or are bothersome): Flushing Headache Joint pain Muscle pain Nausea Pain, redness, or irritation at injection site This list may not describe all possible side effects. Call your doctor for medical advice about side effects. You may report side effects to FDA at 1-800-FDA-1088. Where should I keep my medication? This medication is given in a hospital or clinic. It will not be stored at home. NOTE: This sheet is a summary. It may not cover all possible information. If you have questions about this medicine, talk to your doctor, pharmacist, or health care provider.  2024 Elsevier/Gold Standard (2023-05-16 00:00:00)

## 2024-11-11 ENCOUNTER — Inpatient Hospital Stay

## 2024-11-11 VITALS — BP 125/81 | HR 84 | Temp 97.8°F | Resp 18

## 2024-11-11 DIAGNOSIS — N920 Excessive and frequent menstruation with regular cycle: Secondary | ICD-10-CM | POA: Diagnosis not present

## 2024-11-11 DIAGNOSIS — D500 Iron deficiency anemia secondary to blood loss (chronic): Secondary | ICD-10-CM | POA: Diagnosis not present

## 2024-11-11 DIAGNOSIS — D508 Other iron deficiency anemias: Secondary | ICD-10-CM

## 2024-11-11 MED ORDER — SODIUM CHLORIDE 0.9 % IV SOLN: Freq: Once | INTRAVENOUS | Status: AC

## 2024-11-11 MED ORDER — IRON SUCROSE 300 MG IVPB - SIMPLE MED
300.0000 mg | Freq: Once | Status: AC
  Administered 2024-11-11: 300 mg via INTRAVENOUS
  Filled 2024-11-11: qty 300

## 2024-11-11 NOTE — Patient Instructions (Signed)

## 2024-11-19 ENCOUNTER — Other Ambulatory Visit (HOSPITAL_COMMUNITY): Payer: Self-pay

## 2024-11-22 ENCOUNTER — Other Ambulatory Visit: Payer: Self-pay

## 2024-11-23 ENCOUNTER — Other Ambulatory Visit (HOSPITAL_COMMUNITY): Payer: Self-pay

## 2024-11-23 ENCOUNTER — Encounter: Payer: Self-pay | Admitting: Family

## 2024-11-23 ENCOUNTER — Other Ambulatory Visit: Payer: Self-pay

## 2024-11-24 ENCOUNTER — Other Ambulatory Visit: Payer: Self-pay

## 2024-11-25 ENCOUNTER — Other Ambulatory Visit: Payer: Self-pay

## 2024-12-14 ENCOUNTER — Encounter: Payer: Self-pay | Admitting: Physician Assistant

## 2024-12-14 ENCOUNTER — Encounter: Payer: Self-pay | Admitting: Family

## 2024-12-14 ENCOUNTER — Other Ambulatory Visit: Payer: Self-pay

## 2024-12-14 ENCOUNTER — Inpatient Hospital Stay (HOSPITAL_BASED_OUTPATIENT_CLINIC_OR_DEPARTMENT_OTHER): Admitting: Family

## 2024-12-14 ENCOUNTER — Inpatient Hospital Stay: Attending: Hematology & Oncology

## 2024-12-14 DIAGNOSIS — D508 Other iron deficiency anemias: Secondary | ICD-10-CM

## 2024-12-14 DIAGNOSIS — N920 Excessive and frequent menstruation with regular cycle: Secondary | ICD-10-CM | POA: Diagnosis not present

## 2024-12-14 DIAGNOSIS — D500 Iron deficiency anemia secondary to blood loss (chronic): Secondary | ICD-10-CM | POA: Diagnosis present

## 2024-12-14 LAB — CBC WITH DIFFERENTIAL (CANCER CENTER ONLY)
Abs Immature Granulocytes: 0.01 K/uL (ref 0.00–0.07)
Basophils Absolute: 0.1 K/uL (ref 0.0–0.1)
Basophils Relative: 1 %
Eosinophils Absolute: 0.3 K/uL (ref 0.0–0.5)
Eosinophils Relative: 5 %
HCT: 36.1 % (ref 36.0–46.0)
Hemoglobin: 11.8 g/dL — ABNORMAL LOW (ref 12.0–15.0)
Immature Granulocytes: 0 %
Lymphocytes Relative: 31 %
Lymphs Abs: 2.2 K/uL (ref 0.7–4.0)
MCH: 28 pg (ref 26.0–34.0)
MCHC: 32.7 g/dL (ref 30.0–36.0)
MCV: 85.7 fL (ref 80.0–100.0)
Monocytes Absolute: 0.4 K/uL (ref 0.1–1.0)
Monocytes Relative: 5 %
Neutro Abs: 3.9 K/uL (ref 1.7–7.7)
Neutrophils Relative %: 58 %
Platelet Count: 334 K/uL (ref 150–400)
RBC: 4.21 MIL/uL (ref 3.87–5.11)
RDW: 13.5 % (ref 11.5–15.5)
WBC Count: 6.9 K/uL (ref 4.0–10.5)
nRBC: 0 % (ref 0.0–0.2)

## 2024-12-14 LAB — IRON AND IRON BINDING CAPACITY (CC-WL,HP ONLY)
Iron: 52 ug/dL (ref 28–170)
Saturation Ratios: 19 % (ref 10.4–31.8)
TIBC: 280 ug/dL (ref 250–450)
UIBC: 228 ug/dL

## 2024-12-14 LAB — RETICULOCYTES
Immature Retic Fract: 4.8 % (ref 2.3–15.9)
RBC.: 4.2 MIL/uL (ref 3.87–5.11)
Retic Count, Absolute: 50.4 K/uL (ref 19.0–186.0)
Retic Ct Pct: 1.2 % (ref 0.4–3.1)

## 2024-12-14 NOTE — Progress Notes (Signed)
 " Hematology and Oncology Follow Up Visit  Cathy Daniels [id number] 1986/10/16 38 y.o. 12/14/2024   Principle Diagnosis:  Iron  deficiency anemia secondary to heavy cycle   Current Therapy:        IV iron  as indicated    Interim History:  Cathy Daniels is here today for follow-up. She is doing well after receiving IV iron  in November. She can tell that her physical symptoms have been a little better.  She still has some fatigue.  Hgb is 11.8, MCV 85, platelets 334 and WBC count is 6.9.  Her cycle remains regular with heavy flow. She has an appointment with gynecology to discuss uterine artery embolization.  No other blood loss noted.  No fever, chills, n/v, cough, rash, dizziness, SOB, chest pain, palpitations, abdominal pain or changes in bowel or bladder habits.  No swelling in her extremities.  No falls or syncope.  Appetite and hydration are good. Weight is stable at 204 lbs.   ECOG Performance Status: 1 - Symptomatic but completely ambulatory  Medications:  Allergies as of 12/14/2024       Reactions   Ferrous Sulfate Hives   Repliva OK   Sulfamethoxazole-trimethoprim Other (See Comments)   Sulfonamide Derivatives Hives   Latex Hives, Itching, Rash   Itching and rash when wearing latex gloves        Medication List        Accurate as of December 14, 2024  3:07 PM. If you have any questions, ask your nurse or doctor.          Advair  Diskus 250-50 MCG/ACT Aepb Generic drug: fluticasone -salmeterol Inhale 1 puff into the lungs in the morning and at bedtime.   ASHWAGANDHA PO Take 1 tablet by mouth at bedtime.   fluticasone  50 MCG/ACT nasal spray Commonly known as: FLONASE  Place 2 sprays into both nostrils daily.   levothyroxine  112 MCG tablet Commonly known as: SYNTHROID  Take 1 tablet (112 mcg total) by mouth every morning on an empty stomach.   Mounjaro  7.5 MG/0.5ML Pen Generic drug: tirzepatide  Inject 7.5 mg into the skin once a week.   naproxen  500 MG  tablet Commonly known as: NAPROSYN  Take 1 tablet (500 mg total) by mouth 2 (two) times daily with a meal. Take every day for one week, then use as needed after that   Ozempic  (2 MG/DOSE) 8 MG/3ML Sopn Generic drug: Semaglutide  (2 MG/DOSE) Inject 2 mg as directed once a week.   Vitamin D  (Ergocalciferol ) 1.25 MG (50000 UNIT) Caps capsule Commonly known as: DRISDOL  Take 1 capsule (50,000 Units total) by mouth every 7 (seven) days.        Allergies: Allergies[1]  Past Medical History, Surgical history, Social history, and Family History were reviewed and updated.  Review of Systems: All other 10 point review of systems is negative.   Physical Exam:  vitals were not taken for this visit.   Wt Readings from Last 3 Encounters:  06/02/24 194 lb (88 kg)  05/21/24 197 lb 1.9 oz (89.4 kg)  04/01/24 196 lb (88.9 kg)    Ocular: Sclerae unicteric, pupils equal, round and reactive to light Ear-nose-throat: Oropharynx clear, dentition fair Lymphatic: No cervical or supraclavicular adenopathy Lungs no rales or rhonchi, good excursion bilaterally Heart regular rate and rhythm, no murmur appreciated Abd soft, nontender, positive bowel sounds MSK no focal spinal tenderness, no joint edema Neuro: non-focal, well-oriented, appropriate affect Breasts: Deferred   Lab Results  Component Value Date   WBC 8.4 10/06/2024   HGB  12.3 10/06/2024   HCT 37.7 10/06/2024   MCV 88 10/06/2024   PLT 364 10/06/2024   Lab Results  Component Value Date   FERRITIN 89 05/21/2024   IRON  38 10/06/2024   TIBC 307 10/06/2024   UIBC 269 10/06/2024   IRONPCTSAT 12 (L) 10/06/2024   Lab Results  Component Value Date   RETICCTPCT 1.3 05/21/2024   RBC 4.30 10/06/2024   RETICCTABS 60,580 01/29/2017   No results found for: KPAFRELGTCHN, LAMBDASER, KAPLAMBRATIO No results found for: IGGSERUM, IGA, IGMSERUM No results found for: STEPHANY CARLOTA BENSON MARKEL EARLA JOANNIE DOC, MSPIKE, SPEI   Chemistry      Component Value Date/Time   NA 141 10/06/2024 0300   K 4.2 10/06/2024 0300   CL 104 10/06/2024 0300   CO2 21 10/06/2024 0300   BUN 11 10/06/2024 0300   CREATININE 0.70 10/06/2024 0300   CREATININE 0.69 06/03/2022 0000      Component Value Date/Time   CALCIUM 9.3 10/06/2024 0300   ALKPHOS 70 10/06/2024 0300   AST 13 10/06/2024 0300   ALT 8 10/06/2024 0300   BILITOT 0.3 10/06/2024 0300       Impression and Plan: Cathy Daniels is a very pleasant 38 yo female with long history of IDA secondary to heavy cycles. Iron  studies are pending. We will replace if needed.  Follow-up in 3 months.   Lauraine Pepper, NP 12/23/20253:07 PM     [1]  Allergies Allergen Reactions   Ferrous Sulfate Hives    Repliva OK   Sulfamethoxazole-Trimethoprim Other (See Comments)   Sulfonamide Derivatives Hives   Latex Hives, Itching and Rash    Itching and rash when wearing latex gloves   "

## 2024-12-15 LAB — FERRITIN: Ferritin: 229 ng/mL (ref 11–307)

## 2024-12-17 ENCOUNTER — Other Ambulatory Visit (HOSPITAL_COMMUNITY): Payer: Self-pay

## 2024-12-17 ENCOUNTER — Other Ambulatory Visit: Payer: Self-pay

## 2024-12-17 MED ORDER — TIRZEPATIDE 10 MG/0.5ML ~~LOC~~ SOAJ
10.0000 mg | SUBCUTANEOUS | 0 refills | Status: AC
  Filled 2024-12-17: qty 2, 28d supply, fill #0
  Filled 2025-01-13: qty 2, 28d supply, fill #1

## 2024-12-20 ENCOUNTER — Encounter (HOSPITAL_COMMUNITY): Payer: Self-pay

## 2024-12-21 ENCOUNTER — Other Ambulatory Visit (HOSPITAL_COMMUNITY): Payer: Self-pay

## 2024-12-21 LAB — OPHTHALMOLOGY REPORT-SCANNED

## 2025-01-07 ENCOUNTER — Other Ambulatory Visit (HOSPITAL_COMMUNITY): Payer: Self-pay

## 2025-01-13 ENCOUNTER — Other Ambulatory Visit: Payer: Self-pay | Admitting: Family Medicine

## 2025-01-13 ENCOUNTER — Other Ambulatory Visit: Payer: Self-pay

## 2025-01-13 NOTE — Telephone Encounter (Signed)
 Last refill was 06/14/22. Please advise if refill is appropriate. Thanks in advance.

## 2025-01-14 ENCOUNTER — Other Ambulatory Visit (HOSPITAL_COMMUNITY): Payer: Self-pay

## 2025-01-14 MED ORDER — FLUTICASONE-SALMETEROL 250-50 MCG/ACT IN AEPB
1.0000 | INHALATION_SPRAY | Freq: Two times a day (BID) | RESPIRATORY_TRACT | 3 refills | Status: AC
  Filled 2025-01-14: qty 180, 90d supply, fill #0

## 2025-01-17 ENCOUNTER — Other Ambulatory Visit (HOSPITAL_COMMUNITY): Payer: Self-pay

## 2025-01-17 ENCOUNTER — Other Ambulatory Visit: Payer: Self-pay

## 2025-02-04 ENCOUNTER — Ambulatory Visit (HOSPITAL_BASED_OUTPATIENT_CLINIC_OR_DEPARTMENT_OTHER): Payer: Commercial Managed Care - PPO | Admitting: Obstetrics & Gynecology

## 2025-02-10 ENCOUNTER — Ambulatory Visit (HOSPITAL_BASED_OUTPATIENT_CLINIC_OR_DEPARTMENT_OTHER): Payer: Commercial Managed Care - PPO | Admitting: Obstetrics & Gynecology

## 2025-03-10 ENCOUNTER — Ambulatory Visit (HOSPITAL_BASED_OUTPATIENT_CLINIC_OR_DEPARTMENT_OTHER): Admitting: Obstetrics & Gynecology

## 2025-03-21 ENCOUNTER — Inpatient Hospital Stay: Admitting: Family

## 2025-03-21 ENCOUNTER — Inpatient Hospital Stay

## 2025-06-01 ENCOUNTER — Encounter: Admitting: Physician Assistant

## 2025-06-03 ENCOUNTER — Encounter: Admitting: Physician Assistant
